# Patient Record
Sex: Male | Born: 1960
Health system: Southern US, Community
[De-identification: ages and names within clinical notes are randomized; demographics above are authoritative.]

## PROBLEM LIST (undated history)

## (undated) DIAGNOSIS — K219 Gastro-esophageal reflux disease without esophagitis: Secondary | ICD-10-CM

## (undated) DIAGNOSIS — E785 Hyperlipidemia, unspecified: Secondary | ICD-10-CM

## (undated) DIAGNOSIS — J309 Allergic rhinitis, unspecified: Secondary | ICD-10-CM

## (undated) DIAGNOSIS — E119 Type 2 diabetes mellitus without complications: Secondary | ICD-10-CM

## (undated) DIAGNOSIS — I10 Essential (primary) hypertension: Secondary | ICD-10-CM

## (undated) DIAGNOSIS — G40909 Epilepsy, unspecified, not intractable, without status epilepticus: Secondary | ICD-10-CM

## (undated) DIAGNOSIS — J4540 Moderate persistent asthma, uncomplicated: Secondary | ICD-10-CM

## (undated) DIAGNOSIS — E8881 Metabolic syndrome: Secondary | ICD-10-CM

## (undated) DIAGNOSIS — E88819 Insulin resistance, unspecified: Secondary | ICD-10-CM

## (undated) HISTORY — DX: Gastro-esophageal reflux disease without esophagitis: K21.9

## (undated) HISTORY — DX: Insulin resistance, unspecified: E88.819

## (undated) HISTORY — DX: Hyperlipidemia, unspecified: E78.5

## (undated) HISTORY — DX: Metabolic syndrome: E88.81

## (undated) HISTORY — DX: Epilepsy, unspecified, not intractable, without status epilepticus: G40.909

## (undated) HISTORY — PX: SINOSCOPY: SHX187

## (undated) HISTORY — DX: Allergic rhinitis, unspecified: J30.9

## (undated) HISTORY — DX: Moderate persistent asthma, uncomplicated: J45.40

## (undated) HISTORY — DX: Essential (primary) hypertension: I10

---

## 2008-08-29 HISTORY — PX: COLONOSCOPY: SHX174

## 2011-08-06 ENCOUNTER — Ambulatory Visit: Payer: Self-pay

## 2011-09-12 ENCOUNTER — Ambulatory Visit: Payer: Self-pay

## 2012-06-29 ENCOUNTER — Ambulatory Visit: Payer: Self-pay | Admitting: Family Medicine

## 2012-07-02 ENCOUNTER — Encounter: Payer: Self-pay | Admitting: Family Medicine

## 2012-07-02 ENCOUNTER — Ambulatory Visit (INDEPENDENT_AMBULATORY_CARE_PROVIDER_SITE_OTHER): Payer: PRIVATE HEALTH INSURANCE | Admitting: Family Medicine

## 2012-07-02 VITALS — BP 117/82 | HR 69 | Temp 98.0°F | Ht 74.0 in | Wt 221.0 lb

## 2012-07-02 DIAGNOSIS — J019 Acute sinusitis, unspecified: Secondary | ICD-10-CM

## 2012-07-02 DIAGNOSIS — H6120 Impacted cerumen, unspecified ear: Secondary | ICD-10-CM

## 2012-07-02 MED ORDER — FLUTICASONE PROPIONATE 50 MCG/ACT NA SUSP: NASAL | Status: DC

## 2012-07-02 MED ORDER — AMOXICILLIN-POT CLAVULANATE 875-125 MG PO TABS: 1.0000 | ORAL_TABLET | Freq: Two times a day (BID) | ORAL | Status: DC

## 2012-07-02 NOTE — Progress Notes (Signed)
Office Note 07/02/2012  CC:  Chief Complaint  Patient presents with  . Establish Care    ? bronchitis    HPI:  Frank Potter is a 51 y.o. White male who is here today to establish care and discuss respiratory sx's. Patient's most recent primary MD: Dr. Catha Gosselin at [facility] [employer] [facility]. Old records were not reviewed prior to or during today's visit.  Pt presents complaining of respiratory symptoms for 7  days.  Primary symptoms are: nasal congest, chest congestion.  Initially had subjective f/c.  Worst symptoms seems to be the excessive yellow/thick phlegm.  Lately the symptoms seem to be worsening.  HA at onset but no since then.  Scratchy throat at onset but gone after 24h. Pertinent negatives: Symptoms made worse by nothing.  Symptoms improved by blowing nose. Smoker? no Recent sick contact? no Muscle or joint aches? no Flu shot this season at least 2 wks ago? No.  He declines this today.  Additional ROS: no n/v/d or abdominal pain.  No rash.  No neck stiffness.   +Mild fatigue.  +Mild appetite loss.   Past Medical History  Diagnosis Date  . HTN (hypertension)   . Hyperlipidemia   . GERD (gastroesophageal reflux disease)   . Insulin resistance     Wt loss helped (55 lb wt loss)  . Seizure disorder     Post-traumatic; has been off meds and seizure-free since 06/1996.    Past Surgical History  Procedure Date  . Colonoscopy 2010    Normal per pt    No family history on file.  History   Social History  . Marital Status: Married    Spouse Name: N/A    Number of Children: N/A  . Years of Education: N/A   Occupational History  . Not on file.   Social History Main Topics  . Smoking status: Never Smoker   . Smokeless tobacco: Never Used  . Alcohol Use: No  . Drug Use: No  . Sexually Active: Not on file   Other Topics Concern  . 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  Rare alcohol.  No drugs.    Outpatient Encounter Prescriptions as of 07/02/2012  Medication Sig Dispense Refill  . amLODipine-olmesartan (AZOR) 5-20 MG per tablet Take 1 tablet by mouth daily.      Marland Kitchen amoxicillin-clavulanate (AUGMENTIN) 875-125 MG per tablet Take 1 tablet by mouth 2 (two) times daily.  20 tablet  0  . aspirin EC 81 MG tablet Take 243 mg by mouth daily.      Marland Kitchen atorvastatin (LIPITOR) 10 MG tablet Take 10 mg by mouth daily.      . Cinnamon 500 MG TABS Take 6 tablets by mouth daily.      . fluticasone (FLONASE) 50 MCG/ACT nasal spray 2 sprays each nostril once daily  16 g  6  . Omega-3 Fatty Acids (FISH OIL) 1000 MG CAPS Take 3 capsules by mouth daily.      . ranitidine (ZANTAC) 150 MG tablet Take 150 mg by mouth daily.        No Known Allergies  ROS Review of Systems  Constitutional: Negative for fever and fatigue.  HENT:       See hpi  Eyes: Negative for visual disturbance.  Respiratory: Negative for cough.   Cardiovascular: Negative for chest pain.  Gastrointestinal: Negative for nausea and  abdominal pain.  Genitourinary: Negative for dysuria.  Musculoskeletal: Negative for back pain and joint swelling.  Skin: Negative for rash.  Neurological: Negative for weakness and headaches.  Hematological: Negative for adenopathy.    PE; Blood pressure 117/82, pulse 69, temperature 98 F (36.7 C), temperature source Temporal, height 6\' 2"  (1.88 m), weight 221 lb (100.245 kg), SpO2 98.00%. VS: noted--normal. Gen: alert, NAD, NONTOXIC APPEARING.  He sounds very nasally when speaking. HEENT: eyes without injection, drainage, or swelling.  Ears: EACs with 100% cerumen impactions bilaterally.   Nose:Crusty exudate adherent to mildly injected mucosa.   Purulent d/c noted in left nostril.  Mild paranasal sinus TTP.  No facial swelling.  Throat and mouth without focal lesion.  No pharyngial  swelling, erythema, or exudate.   Neck: supple, no LAD.   LUNGS: CTA bilat, nonlabored resps.   CV: RRR, no m/r/g. EXT: no c/c/e SKIN: no rash  Pertinent labs:  None today  ASSESSMENT AND PLAN:   New Pt: obtain old records.  Sinusitis, acute I think he has minimal bronchitic component with this illness. Augmentin 875/125, 1 bid x 10d. Restart flonase qd.  Restart saline nasal spray qd. Mucinex DM OTC recommended.  Recommended flu vaccine but pt declined this today.  Cerumen impaction Bilateral 100%. Removed with irrigation by nurse today.   An After Visit Summary was printed and given to the patient.  Return if symptoms worsen or fail to improve.

## 2012-07-02 NOTE — Assessment & Plan Note (Signed)
I think he has minimal bronchitic component with this illness. Augmentin 875/125, 1 bid x 10d. Restart flonase qd.  Restart saline nasal spray qd. Mucinex DM OTC recommended.  Recommended flu vaccine but pt declined this today.

## 2012-07-02 NOTE — Assessment & Plan Note (Addendum)
Bilateral 100%. Removed with irrigation by nurse today.

## 2012-07-02 NOTE — Patient Instructions (Signed)
Buy Mucinex DM over the counter and follow instructions on package.

## 2015-01-28 ENCOUNTER — Other Ambulatory Visit: Payer: Self-pay | Admitting: Family Medicine

## 2015-01-28 DIAGNOSIS — R319 Hematuria, unspecified: Secondary | ICD-10-CM

## 2015-02-06 ENCOUNTER — Ambulatory Visit
Admission: RE | Admit: 2015-02-06 | Discharge: 2015-02-06 | Disposition: A | Discharge status: Home / Self Care | Payer: BLUE CROSS/BLUE SHIELD | Source: Ambulatory Visit | Attending: Family Medicine | Admitting: Family Medicine

## 2015-02-06 DIAGNOSIS — R319 Hematuria, unspecified: Secondary | ICD-10-CM

## 2015-06-26 ENCOUNTER — Other Ambulatory Visit: Payer: Self-pay | Admitting: Allergy and Immunology

## 2015-07-24 ENCOUNTER — Other Ambulatory Visit: Payer: Self-pay | Admitting: Allergy and Immunology

## 2015-08-08 ENCOUNTER — Other Ambulatory Visit: Payer: Self-pay | Admitting: Allergy and Immunology

## 2015-09-18 ENCOUNTER — Other Ambulatory Visit: Payer: Self-pay | Admitting: Allergy and Immunology

## 2015-11-11 ENCOUNTER — Other Ambulatory Visit: Payer: Self-pay | Admitting: Allergy and Immunology

## 2016-01-08 ENCOUNTER — Other Ambulatory Visit: Payer: Self-pay | Admitting: Allergy and Immunology

## 2016-08-20 ENCOUNTER — Other Ambulatory Visit: Payer: Self-pay | Admitting: Allergy and Immunology

## 2016-09-10 ENCOUNTER — Other Ambulatory Visit: Payer: Self-pay | Admitting: Allergy and Immunology

## 2016-10-04 ENCOUNTER — Ambulatory Visit (INDEPENDENT_AMBULATORY_CARE_PROVIDER_SITE_OTHER): Payer: BLUE CROSS/BLUE SHIELD | Admitting: Allergy and Immunology

## 2016-10-04 ENCOUNTER — Encounter: Payer: Self-pay | Admitting: Allergy and Immunology

## 2016-10-04 VITALS — BP 128/82 | HR 78 | Temp 98.2°F | Resp 16 | Ht 73.0 in | Wt 235.0 lb

## 2016-10-04 DIAGNOSIS — J4530 Mild persistent asthma, uncomplicated: Secondary | ICD-10-CM | POA: Insufficient documentation

## 2016-10-04 DIAGNOSIS — J4540 Moderate persistent asthma, uncomplicated: Secondary | ICD-10-CM | POA: Diagnosis not present

## 2016-10-04 DIAGNOSIS — J3089 Other allergic rhinitis: Secondary | ICD-10-CM | POA: Insufficient documentation

## 2016-10-04 DIAGNOSIS — Z8709 Personal history of other diseases of the respiratory system: Secondary | ICD-10-CM

## 2016-10-04 HISTORY — DX: Moderate persistent asthma, uncomplicated: J45.40

## 2016-10-04 MED ORDER — BECLOMETHASONE DIPROPIONATE 80 MCG/ACT IN AERS: 2.0000 | INHALATION_SPRAY | Freq: Two times a day (BID) | RESPIRATORY_TRACT | 5 refills | Status: DC

## 2016-10-04 NOTE — Patient Instructions (Addendum)
Moderate persistent asthma  Restart Qvar 80 g, one inhalation via spacer device twice a day.  Continue montelukast 10 mg daily at bedtime.  I have encouraged the patient to use albuterol HFA on an as needed rather than scheduled basis.  He is to take 1-2 inhalations every 4-6 hours as needed.  Subjective and objective measures of pulmonary function will be followed and the treatment plan will be adjusted accordingly.  Allergic rhinitis  Continue appropriate allergen avoidance measures, montelukast 10 mg daily, and fluticasone nasal spray daily or every other day.  I have also recommended nasal saline spray (i.e., Simply Saline) or nasal saline lavage (i.e., NeilMed) as needed prior to medicated nasal sprays.  History of nasal polyps  Continue fluticasone nasal spray every day or every other day preceded by nasal saline spray or lavage (as above).   Return in about 5 months (around 03/03/2017), or if symptoms worsen or fail to improve.

## 2016-10-04 NOTE — Assessment & Plan Note (Signed)
   Continue appropriate allergen avoidance measures, montelukast 10 mg daily, and fluticasone nasal spray daily or every other day.  I have also recommended nasal saline spray (i.e., Simply Saline) or nasal saline lavage (i.e., NeilMed) as needed prior to medicated nasal sprays.

## 2016-10-04 NOTE — Progress Notes (Signed)
Follow-up Note  RE: Frank Potter MRN: [id number] DOB: 1961/01/25 Date of Office Visit: 10/04/2016  Primary care provider: Tammi Sou, MD Referring provider: Tammi Sou, MD  History of present illness: Frank Potter is a 56 y.o. male with persistent asthma, allergic rhinitis, and history of nasal polyps presenting today for follow up.  He was last seen in this clinic in April 2016.  He reports that he ran out Qvar approximately one year ago.  He went to the pharmacy to pick up his refill and was given albuterol HFA, therefore he has been taking albuterol 1-2 times daily.  His upper and lower respiratory symptoms seem to be triggered by dry air and air pollutants which he comes in to contact with on a weekly basis while traveling out to [facility] and [place].  He takes montelukast 10 mg daily and is using fluticasone nasal spray on a daily basis as well.  He still experiences anosmia, but does not complain of persistent nasal congestion.   Assessment and plan: Moderate persistent asthma  Restart Qvar 80 g, one inhalation via spacer device twice a day.  Continue montelukast 10 mg daily at bedtime.  I have encouraged the patient to use albuterol HFA on an as needed rather than scheduled basis.  He is to take 1-2 inhalations every 4-6 hours as needed.  Subjective and objective measures of pulmonary function will be followed and the treatment plan will be adjusted accordingly.  Allergic rhinitis  Continue appropriate allergen avoidance measures, montelukast 10 mg daily, and fluticasone nasal spray daily or every other day.  I have also recommended nasal saline spray (i.e., Simply Saline) or nasal saline lavage (i.e., NeilMed) as needed prior to medicated nasal sprays.  History of nasal polyps  Continue fluticasone nasal spray every day or every other day preceded by nasal saline spray or lavage (as above).   Meds ordered this encounter  Medications  .  beclomethasone (QVAR) 80 MCG/ACT inhaler    Sig: Inhale 2 puffs into the lungs 2 (two) times daily.    Dispense:  1 Inhaler    Refill:  5    Diagnostics: Spirometry reveals an FVC of 4.81 L and an FEV1 of 3.39 L (82% predicted) without post bronchodilator improvement.  Please see scanned spirometry results for details.    Physical examination: Blood pressure 128/82, pulse 78, temperature 98.2 F (36.8 C), temperature source Oral, resp. rate 16, height 6\' 1"  (1.854 m), weight 235 lb (106.6 kg), SpO2 96 %.  General: Alert, interactive, in no acute distress. HEENT: TMs pearly gray, turbinates mildly edematous without discharge, post-pharynx mildly erythematous. Neck: Supple without lymphadenopathy. Lungs: Clear to auscultation without wheezing, rhonchi or rales. CV: Normal S1, S2 without murmurs. Skin: Warm and dry, without lesions or rashes.  The following portions of the patient's history were reviewed and updated as appropriate: allergies, current medications, past family history, past medical history, past social history, past surgical history and problem list.  Allergies as of 10/04/2016      Reactions   Bee Venom Swelling   Lac Bovis    Other reaction(s): Abdominal Pain Milk      Medication List       Accurate as of 10/04/16  1:44 PM. Always use your most recent med list.          aspirin EC 81 MG tablet Take 243 mg by mouth daily.   atorvastatin 10 MG tablet Commonly known as:  LIPITOR Take 10 mg by  mouth daily.   AZOR 5-20 MG tablet Generic drug:  amLODipine-olmesartan Take 1 tablet by mouth daily.   beclomethasone 80 MCG/ACT inhaler Commonly known as:  QVAR Inhale 2 puffs into the lungs 2 (two) times daily.   Cinnamon 500 MG Tabs Take 6 tablets by mouth daily.   Coenzyme Q10-Vitamin E 100-150 MG-UNIT Caps Take 150 mg by mouth daily.   Fish Oil 1000 MG Caps Take 3 capsules by mouth daily.   fluticasone 50 MCG/ACT nasal spray Commonly known as:   FLONASE 2 sprays each nostril once daily   montelukast 10 MG tablet Commonly known as:  SINGULAIR TAKE 1 TABLET BY MOUTH EVERY EVENING FOR COUGH OR WHEEZING   PROAIR HFA 108 (90 Base) MCG/ACT inhaler Generic drug:  albuterol INHALE 2 PUFFS INTO THE LUNGS EVERY 4 TO 6 HOURS AS NEEDED FOR COUGH OR WHEEZE. USE SPACER IF NEEDED   ranitidine 150 MG tablet Commonly known as:  ZANTAC Take 150 mg by mouth daily.       Allergies  Allergen Reactions  . Bee Venom Swelling  . Lac Bovis     Other reaction(s): Abdominal Pain Milk   Review of systems: Review of systems negative except as noted in HPI / PMHx or noted below: Constitutional: Negative.  HENT: Negative.   Eyes: Negative.  Respiratory: Negative.   Cardiovascular: Negative.  Gastrointestinal: Negative.  Genitourinary: Negative.  Musculoskeletal: Negative.  Neurological: Negative.  Endo/Heme/Allergies: Negative.  Cutaneous: Negative.  Past Medical History:  Diagnosis Date  . GERD (gastroesophageal reflux disease)   . HTN (hypertension)   . Hyperlipidemia   . Insulin resistance    Wt loss helped (55 lb wt loss)  . Moderate persistent asthma 10/04/2016  . Seizure disorder ([place])    Post-traumatic; has been off meds and seizure-free since 06/1996.    Family History  Problem Relation Age of Onset  . Allergic rhinitis Paternal Uncle     Social History   Social History  . Marital status: Married    Spouse name: N/A  . Number of children: N/A  . Years of education: N/A   Occupational History  . Not on file.   Social History Main Topics  . Smoking status: Never Smoker  . Smokeless tobacco: Never Used  . Alcohol use No  . Drug use: No  . Sexual activity: Not on file   Other Topics Concern  . Not on file   Social History Narrative   Married, no children.   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   [employer].   Native [place].   No tobacco.  Rare alcohol.  No  drugs.   Review of systems: Review of systems negative except as noted in HPI / PMHx or noted below: Constitutional: Negative.  HENT: Negative.   Eyes: Negative.  Respiratory: Negative.   Cardiovascular: Negative.  Gastrointestinal: Negative.  Genitourinary: Negative.  Musculoskeletal: Negative.  Neurological: Negative.  Endo/Heme/Allergies: Negative.  Cutaneous: Negative.  Past Medical History:  Diagnosis Date  . GERD (gastroesophageal reflux disease)   . HTN (hypertension)   . Hyperlipidemia   . Insulin resistance    Wt loss helped (55 lb wt loss)  . Moderate persistent asthma 10/04/2016  . Seizure disorder ([place])    Post-traumatic; has been off meds and seizure-free since 06/1996.    Family History  Problem Relation Age of Onset  . Allergic rhinitis Paternal Uncle     Social History   Social History  . Marital status: Married  Spouse name: N/A  . Number of children: N/A  . Years of education: N/A   Occupational History  . Not on file.   Social History Main Topics  . Smoking status: Never Smoker  . Smokeless tobacco: Never Used  . Alcohol use No  . Drug use: No  . Sexual activity: Not on file   Other Topics Concern  . Not on file   Social History Narrative   Married, no children.   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   [employer].   Native [place].   No tobacco.  Rare alcohol.  No drugs.    I appreciate the opportunity to take part in Yosiel's care. Please do not hesitate to contact me with questions.  Sincerely,   R. Edgar Frisk, MD

## 2016-10-04 NOTE — Assessment & Plan Note (Signed)
   Continue fluticasone nasal spray every day or every other day preceded by nasal saline spray or lavage (as above).

## 2016-10-04 NOTE — Assessment & Plan Note (Addendum)
   Restart Qvar 80 g, one inhalation via spacer device twice a day.  Continue montelukast 10 mg daily at bedtime.  I have encouraged the patient to use albuterol HFA on an as needed rather than scheduled basis.  He is to take 1-2 inhalations every 4-6 hours as needed.  Subjective and objective measures of pulmonary function will be followed and the treatment plan will be adjusted accordingly.

## 2017-01-09 ENCOUNTER — Other Ambulatory Visit: Payer: Self-pay | Admitting: *Deleted

## 2017-01-09 MED ORDER — FLUTICASONE PROPIONATE HFA 110 MCG/ACT IN AERO
2.0000 | INHALATION_SPRAY | Freq: Two times a day (BID) | RESPIRATORY_TRACT | 5 refills | Status: DC
Start: 2017-01-09 — End: 2017-11-23

## 2017-03-06 ENCOUNTER — Ambulatory Visit: Payer: BLUE CROSS/BLUE SHIELD | Admitting: Allergy and Immunology

## 2017-04-24 ENCOUNTER — Other Ambulatory Visit: Payer: Self-pay

## 2017-04-24 MED ORDER — ALBUTEROL SULFATE HFA 108 (90 BASE) MCG/ACT IN AERS: INHALATION_SPRAY | RESPIRATORY_TRACT | 0 refills | Status: DC

## 2017-04-24 NOTE — Telephone Encounter (Signed)
RF for Proair HFA x 1 with no refills, pt needs an OV

## 2017-05-02 ENCOUNTER — Encounter: Payer: Self-pay | Admitting: Allergy and Immunology

## 2017-05-02 ENCOUNTER — Ambulatory Visit (INDEPENDENT_AMBULATORY_CARE_PROVIDER_SITE_OTHER): Payer: BLUE CROSS/BLUE SHIELD | Admitting: Allergy and Immunology

## 2017-05-02 VITALS — BP 122/80 | HR 80 | Resp 16

## 2017-05-02 DIAGNOSIS — J4540 Moderate persistent asthma, uncomplicated: Secondary | ICD-10-CM

## 2017-05-02 DIAGNOSIS — J3089 Other allergic rhinitis: Secondary | ICD-10-CM

## 2017-05-02 DIAGNOSIS — Z8709 Personal history of other diseases of the respiratory system: Secondary | ICD-10-CM

## 2017-05-02 MED ORDER — MONTELUKAST SODIUM 10 MG PO TABS
ORAL_TABLET | ORAL | 5 refills | Status: DC
Start: 2017-05-02 — End: 2017-11-25

## 2017-05-02 NOTE — Assessment & Plan Note (Signed)
   Continue fluticasone nasal spray daily, preceded by nasal saline irrigation.

## 2017-05-02 NOTE — Assessment & Plan Note (Signed)
   Continue appropriate allergen avoidance measures, montelukast daily, fluticasone nasal spray daily, and nasal saline irrigation as needed.

## 2017-05-02 NOTE — Progress Notes (Signed)
Follow-up Note  RE: Frank Potter MRN: [id number] DOB: Sep 07, 1960 Date of Office Visit: 05/02/2017  Primary care provider: Tammi Sou, MD Referring provider: Tammi Sou, MD  History of present illness: Frank Potter is a 56 y.o. male with persistent asthma, allergic rhinitis, and history of nasal polyps presenting today for follow up.  He was last seen in this clinic in April 2016.  He reports that in the interval since his previous visit his asthma has been relatively well-controlled.  He did experience some wheezing approximately 2 or 3 weeks ago which he believes was due to the filter in his truck needing to be changed.  He currently takes Flovent 110 g, one inhalation via spacer device twice a day, and montelukast 10 mg daily at bedtime.  He experiences occasional nasal congestion and sneezing, particularly with exposure to pollen and pollution.   Assessment and plan: Moderate persistent asthma Currently well controlled.  For now, continue Flovent 110 g, one inhalation via spacer device twice a day, montelukast 10 mg daily bedtime, and albuterol HFA, 1-2 inhalations every 4-6 hours as needed.  During respiratory tract infections or asthma flares, increase Flovent 110g to 3 inhalations 3 times per day until symptoms have returned to baseline.  Subjective and objective measures of pulmonary function will be followed and the treatment plan will be adjusted accordingly.  Allergic rhinitis  Continue appropriate allergen avoidance measures, montelukast daily, fluticasone nasal spray daily, and nasal saline irrigation as needed.  History of nasal polyps  Continue fluticasone nasal spray daily, preceded by nasal saline irrigation.   Meds ordered this encounter  Medications  . montelukast (SINGULAIR) 10 MG tablet    Sig: TAKE 1 TABLET BY MOUTH EVERY EVENING FOR COUGH OR WHEEZING    Dispense:  30 tablet    Refill:  5    Diagnostics: Spirometry:  Normal  with an FEV1 of 90% predicted and an FEV1 ratio of 105%.  Please see scanned spirometry results for details.    Physical examination: Blood pressure 122/80, pulse 80, resp. rate 16.  General: Alert, interactive, in no acute distress. HEENT: TMs pearly gray, turbinates mildly edematous without discharge, post-pharynx unremarkable. Neck: Supple without lymphadenopathy. Lungs: Clear to auscultation without wheezing, rhonchi or rales. CV: Normal S1, S2 without murmurs. Skin: Warm and dry, without lesions or rashes.  The following portions of the patient's history were reviewed and updated as appropriate: allergies, current medications, past family history, past medical history, past social history, past surgical history and problem list.  Allergies as of 05/02/2017      Reactions   Bee Venom Swelling   Lac Bovis    Other reaction(s): Abdominal Pain Milk      Medication List       Accurate as of 05/02/17  7:21 PM. Always use your most recent med list.          albuterol 108 (90 Base) MCG/ACT inhaler Commonly known as:  PROAIR HFA INHALE 2 PUFFS INTO THE LUNGS EVERY 4 TO 6 HOURS AS NEEDED FOR COUGH OR WHEEZE. USE SPACER IF NEEDED   aspirin EC 81 MG tablet Take 243 mg by mouth daily.   atorvastatin 10 MG tablet Commonly known as:  LIPITOR Take 10 mg by mouth daily.   AZOR 5-20 MG tablet Generic drug:  amLODipine-olmesartan Take 1 tablet by mouth daily.   Coenzyme Q10-Vitamin E 100-150 MG-UNIT Caps Take 150 mg by mouth daily.   Fish Oil 1000 MG Caps Take 3  capsules by mouth daily.   fluticasone 110 MCG/ACT inhaler Commonly known as:  FLOVENT HFA Inhale 2 puffs into the lungs 2 (two) times daily.   fluticasone 50 MCG/ACT nasal spray Commonly known as:  FLONASE 2 sprays each nostril once daily   glimepiride 1 MG tablet Commonly known as:  AMARYL   lamoTRIgine 150 MG tablet Commonly known as:  LAMICTAL   metFORMIN 1000 MG tablet Commonly known as:  GLUCOPHAGE     montelukast 10 MG tablet Commonly known as:  SINGULAIR TAKE 1 TABLET BY MOUTH EVERY EVENING FOR COUGH OR WHEEZING   ranitidine 150 MG tablet Commonly known as:  ZANTAC Take 150 mg by mouth daily.            Discharge Care Instructions        Start     Ordered   05/02/17 0000  Spirometry with Graph    Question:  Where should this test be performed?  Answer:  Other   05/02/17 1711   05/02/17 0000  montelukast (SINGULAIR) 10 MG tablet     05/02/17 1711      Allergies  Allergen Reactions  . Bee Venom Swelling  . Lac Bovis     Other reaction(s): Abdominal Pain Milk    I appreciate the opportunity to take part in Destyn's care. Please do not hesitate to contact me with questions.  Sincerely,   R. Edgar Frisk, MD

## 2017-05-02 NOTE — Assessment & Plan Note (Signed)
Currently well controlled.  For now, continue Flovent 110 g, one inhalation via spacer device twice a day, montelukast 10 mg daily bedtime, and albuterol HFA, 1-2 inhalations every 4-6 hours as needed.  During respiratory tract infections or asthma flares, increase Flovent 110g to 3 inhalations 3 times per day until symptoms have returned to baseline.  Subjective and objective measures of pulmonary function will be followed and the treatment plan will be adjusted accordingly.

## 2017-05-02 NOTE — Patient Instructions (Signed)
Moderate persistent asthma Currently well controlled.  For now, continue Flovent 110 g, one inhalation via spacer device twice a day, montelukast 10 mg daily bedtime, and albuterol HFA, 1-2 inhalations every 4-6 hours as needed.  During respiratory tract infections or asthma flares, increase Flovent 110g to 3 inhalations 3 times per day until symptoms have returned to baseline.  Subjective and objective measures of pulmonary function will be followed and the treatment plan will be adjusted accordingly.  Allergic rhinitis  Continue appropriate allergen avoidance measures, montelukast daily, fluticasone nasal spray daily, and nasal saline irrigation as needed.  History of nasal polyps  Continue fluticasone nasal spray daily, preceded by nasal saline irrigation.   Return in about 5 months (around 10/02/2017), or if symptoms worsen or fail to improve.

## 2017-05-04 ENCOUNTER — Ambulatory Visit: Payer: BLUE CROSS/BLUE SHIELD | Admitting: Allergy and Immunology

## 2017-07-03 ENCOUNTER — Encounter: Payer: Self-pay | Admitting: Family Medicine

## 2017-10-18 ENCOUNTER — Ambulatory Visit: Payer: BLUE CROSS/BLUE SHIELD | Admitting: Allergy and Immunology

## 2017-11-23 ENCOUNTER — Ambulatory Visit (INDEPENDENT_AMBULATORY_CARE_PROVIDER_SITE_OTHER): Payer: 59 | Admitting: Allergy and Immunology

## 2017-11-23 ENCOUNTER — Encounter: Payer: Self-pay | Admitting: Allergy and Immunology

## 2017-11-23 VITALS — BP 116/76 | HR 88 | Temp 98.4°F | Resp 16

## 2017-11-23 DIAGNOSIS — Z8709 Personal history of other diseases of the respiratory system: Secondary | ICD-10-CM | POA: Diagnosis not present

## 2017-11-23 DIAGNOSIS — J4540 Moderate persistent asthma, uncomplicated: Secondary | ICD-10-CM

## 2017-11-23 DIAGNOSIS — J3089 Other allergic rhinitis: Secondary | ICD-10-CM | POA: Diagnosis not present

## 2017-11-23 MED ORDER — FLUTICASONE PROPIONATE HFA 110 MCG/ACT IN AERO: 2.0000 | INHALATION_SPRAY | Freq: Two times a day (BID) | RESPIRATORY_TRACT | 5 refills | Status: DC

## 2017-11-23 MED ORDER — ALBUTEROL SULFATE HFA 108 (90 BASE) MCG/ACT IN AERS: INHALATION_SPRAY | RESPIRATORY_TRACT | 1 refills | Status: DC

## 2017-11-23 NOTE — Assessment & Plan Note (Signed)
   For now, continue Flovent 110 g HFA, 1 inhalation via spacer device daily, montelukast 10 mg daily bedtime, and albuterol, 1-2 inhalations every 6 hours if needed.  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  Subjective and objective measures of pulmonary function will be followed and the treatment plan will be adjusted accordingly.

## 2017-11-23 NOTE — Patient Instructions (Signed)
Moderate persistent asthma  For now, continue Flovent 110 g HFA, 1 inhalation via spacer device daily, montelukast 10 mg daily bedtime, and albuterol, 1-2 inhalations every 6 hours if needed.  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  Subjective and objective measures of pulmonary function will be followed and the treatment plan will be adjusted accordingly.  Allergic rhinitis  Continue appropriate allergen avoidance measures, montelukast daily, fluticasone nasal spray daily, and nasal saline irrigation as needed.  History of nasal polyps  Continue fluticasone nasal spray daily, preceded by nasal saline irrigation.   Return in about 6 months (around 05/26/2018), or if symptoms worsen or fail to improve.

## 2017-11-23 NOTE — Assessment & Plan Note (Signed)
   Continue fluticasone nasal spray daily, preceded by nasal saline irrigation.

## 2017-11-23 NOTE — Assessment & Plan Note (Signed)
   Continue appropriate allergen avoidance measures, montelukast daily, fluticasone nasal spray daily, and nasal saline irrigation as needed.

## 2017-11-23 NOTE — Progress Notes (Signed)
Follow-up Note  RE: Frank Potter MRN: [id number] DOB: [birth year] Date of Office Visit: 11/23/2017  Primary care provider: Hulan Fess, MD Referring provider: Tammi Sou, MD  History of present illness: Frank Potter is a 57 y.o. male with persistent asthma, allergic rhinitis, and history of nasal polyps presenting today for follow-up.  He was last seen in this clinic in September 2018.  He reports that in the interval since his previous visit his asthma has been relatively well controlled.  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  He believes this is due to the "stagnant air" in the truck cab.  His nasal allergy symptoms are well controlled while using fluticasone nasal spray daily.  His sense of smell is still minimal.  Assessment and plan: Moderate persistent asthma  For now, continue Flovent 110 g HFA, 1 inhalation via spacer device daily, montelukast 10 mg daily bedtime, and albuterol, 1-2 inhalations every 6 hours if needed.  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  Subjective and objective measures of pulmonary function will be followed and the treatment plan will be adjusted accordingly.  Allergic rhinitis  Continue appropriate allergen avoidance measures, montelukast daily, fluticasone nasal spray daily, and nasal saline irrigation as needed.  History of nasal polyps  Continue fluticasone nasal spray daily, preceded by nasal saline irrigation.   Meds ordered this encounter  Medications  . albuterol (PROAIR HFA) 108 (90 Base) MCG/ACT inhaler    Sig: INHALE 2 PUFFS INTO THE LUNGS EVERY 4 TO 6 HOURS AS NEEDED FOR COUGH OR WHEEZE. USE SPACER IF NEEDED    Dispense:  1 Inhaler    Refill:  1  . fluticasone (FLOVENT HFA) 110 MCG/ACT inhaler    Sig: Inhale 2 puffs into the lungs 2 (two) times daily.    Dispense:  1  Inhaler    Refill:  5    Please keep rx on file. Pt. Will call when needed.    Diagnostics: Spirometry:  Normal with an FEV1 of 94% predicted.  Please see scanned spirometry results for details.    Physical examination: Blood pressure 116/76, pulse 88, temperature 98.4 F (36.9 C), temperature source Oral, resp. rate 16, SpO2 96 %.  General: Alert, interactive, in no acute distress. HEENT: TMs pearly gray, turbinates mildly edematous without discharge, post-pharynx unremarkable. Neck: Supple without lymphadenopathy. Lungs: Clear to auscultation without wheezing, rhonchi or rales. CV: Normal S1, S2 without murmurs. Skin: Warm and dry, without lesions or rashes.  The following portions of the patient's history were reviewed and updated as appropriate: allergies, current medications, past family history, past medical history, past social history, past surgical history and problem list.  Allergies as of 11/23/2017      Reactions   Bee Venom Swelling   Lac Bovis    Other reaction(s): Abdominal Pain Milk      Medication List        Accurate as of 11/23/17  1:34 PM. Always use your most recent med list.          albuterol 108 (90 Base) MCG/ACT inhaler Commonly known as:  PROAIR HFA INHALE 2 PUFFS INTO THE LUNGS EVERY 4 TO 6 HOURS AS NEEDED FOR COUGH OR WHEEZE. USE SPACER IF NEEDED   atorvastatin 10 MG tablet Commonly known as:  LIPITOR Take 10 mg by mouth daily.  AZOR 5-20 MG tablet Generic drug:  amLODipine-olmesartan Take 1 tablet by mouth daily.   CINNAMON PO Take 10,000 mg by mouth daily.   Coenzyme Q10-Vitamin E 100-150 MG-UNIT Caps Take 150 mg by mouth daily.   Fish Oil 1000 MG Caps Take 3 capsules by mouth daily.   fluticasone 110 MCG/ACT inhaler Commonly known as:  FLOVENT HFA Inhale 2 puffs into the lungs 2 (two) times daily.   fluticasone 50 MCG/ACT nasal spray Commonly known as:  FLONASE 2 sprays each nostril once daily   glimepiride 2 MG  tablet Commonly known as:  AMARYL   lamoTRIgine 200 MG tablet Commonly known as:  LAMICTAL   metFORMIN 1000 MG tablet Commonly known as:  GLUCOPHAGE Take 1,000 mg by mouth 2 (two) times daily.   montelukast 10 MG tablet Commonly known as:  SINGULAIR TAKE 1 TABLET BY MOUTH EVERY EVENING FOR COUGH OR WHEEZING   olmesartan 20 MG tablet Commonly known as:  BENICAR   ranitidine 150 MG tablet Commonly known as:  ZANTAC Take 150 mg by mouth daily.       Allergies  Allergen Reactions  . Bee Venom Swelling  . Lac Bovis     Other reaction(s): Abdominal Pain Milk    I appreciate the opportunity to take part in Frank Potter's care. Please do not hesitate to contact me with questions.  Sincerely,   R. Edgar Frisk, MD

## 2017-11-25 ENCOUNTER — Other Ambulatory Visit: Payer: Self-pay | Admitting: Allergy and Immunology

## 2017-11-25 DIAGNOSIS — J4540 Moderate persistent asthma, uncomplicated: Secondary | ICD-10-CM

## 2017-11-25 DIAGNOSIS — J3089 Other allergic rhinitis: Secondary | ICD-10-CM

## 2017-12-21 DIAGNOSIS — M9901 Segmental and somatic dysfunction of cervical region: Secondary | ICD-10-CM | POA: Diagnosis not present

## 2017-12-21 DIAGNOSIS — M5032 Other cervical disc degeneration, mid-cervical region, unspecified level: Secondary | ICD-10-CM | POA: Diagnosis not present

## 2017-12-21 DIAGNOSIS — M9905 Segmental and somatic dysfunction of pelvic region: Secondary | ICD-10-CM | POA: Diagnosis not present

## 2018-01-04 DIAGNOSIS — M5032 Other cervical disc degeneration, mid-cervical region, unspecified level: Secondary | ICD-10-CM | POA: Diagnosis not present

## 2018-01-04 DIAGNOSIS — M9901 Segmental and somatic dysfunction of cervical region: Secondary | ICD-10-CM | POA: Diagnosis not present

## 2018-01-04 DIAGNOSIS — M9905 Segmental and somatic dysfunction of pelvic region: Secondary | ICD-10-CM | POA: Diagnosis not present

## 2018-01-11 DIAGNOSIS — M5032 Other cervical disc degeneration, mid-cervical region, unspecified level: Secondary | ICD-10-CM | POA: Diagnosis not present

## 2018-01-11 DIAGNOSIS — M9905 Segmental and somatic dysfunction of pelvic region: Secondary | ICD-10-CM | POA: Diagnosis not present

## 2018-01-11 DIAGNOSIS — M9901 Segmental and somatic dysfunction of cervical region: Secondary | ICD-10-CM | POA: Diagnosis not present

## 2018-01-18 DIAGNOSIS — M9905 Segmental and somatic dysfunction of pelvic region: Secondary | ICD-10-CM | POA: Diagnosis not present

## 2018-01-18 DIAGNOSIS — M9901 Segmental and somatic dysfunction of cervical region: Secondary | ICD-10-CM | POA: Diagnosis not present

## 2018-01-18 DIAGNOSIS — R197 Diarrhea, unspecified: Secondary | ICD-10-CM | POA: Diagnosis not present

## 2018-01-18 DIAGNOSIS — M5032 Other cervical disc degeneration, mid-cervical region, unspecified level: Secondary | ICD-10-CM | POA: Diagnosis not present

## 2018-01-19 DIAGNOSIS — R197 Diarrhea, unspecified: Secondary | ICD-10-CM | POA: Diagnosis not present

## 2018-01-23 DIAGNOSIS — J45909 Unspecified asthma, uncomplicated: Secondary | ICD-10-CM | POA: Diagnosis not present

## 2018-01-23 DIAGNOSIS — J069 Acute upper respiratory infection, unspecified: Secondary | ICD-10-CM | POA: Diagnosis not present

## 2018-02-01 DIAGNOSIS — M9901 Segmental and somatic dysfunction of cervical region: Secondary | ICD-10-CM | POA: Diagnosis not present

## 2018-02-01 DIAGNOSIS — M9905 Segmental and somatic dysfunction of pelvic region: Secondary | ICD-10-CM | POA: Diagnosis not present

## 2018-02-01 DIAGNOSIS — M5032 Other cervical disc degeneration, mid-cervical region, unspecified level: Secondary | ICD-10-CM | POA: Diagnosis not present

## 2018-02-15 DIAGNOSIS — M9901 Segmental and somatic dysfunction of cervical region: Secondary | ICD-10-CM | POA: Diagnosis not present

## 2018-02-15 DIAGNOSIS — M5032 Other cervical disc degeneration, mid-cervical region, unspecified level: Secondary | ICD-10-CM | POA: Diagnosis not present

## 2018-02-15 DIAGNOSIS — M9905 Segmental and somatic dysfunction of pelvic region: Secondary | ICD-10-CM | POA: Diagnosis not present

## 2018-03-14 NOTE — Progress Notes (Signed)
This note was forwarded/CC'd to me but in review of his chart I see that I am no longer his PCP.

## 2018-03-22 DIAGNOSIS — R197 Diarrhea, unspecified: Secondary | ICD-10-CM | POA: Diagnosis not present

## 2018-03-22 DIAGNOSIS — D126 Benign neoplasm of colon, unspecified: Secondary | ICD-10-CM | POA: Diagnosis not present

## 2018-04-16 DIAGNOSIS — M5032 Other cervical disc degeneration, mid-cervical region, unspecified level: Secondary | ICD-10-CM | POA: Diagnosis not present

## 2018-05-10 DIAGNOSIS — E1169 Type 2 diabetes mellitus with other specified complication: Secondary | ICD-10-CM | POA: Diagnosis not present

## 2018-05-10 DIAGNOSIS — E782 Mixed hyperlipidemia: Secondary | ICD-10-CM | POA: Diagnosis not present

## 2018-05-10 DIAGNOSIS — Z0000 Encounter for general adult medical examination without abnormal findings: Secondary | ICD-10-CM | POA: Diagnosis not present

## 2018-06-17 ENCOUNTER — Other Ambulatory Visit: Payer: Self-pay | Admitting: Allergy and Immunology

## 2018-06-17 DIAGNOSIS — J4540 Moderate persistent asthma, uncomplicated: Secondary | ICD-10-CM

## 2018-06-17 DIAGNOSIS — J3089 Other allergic rhinitis: Secondary | ICD-10-CM

## 2018-06-18 NOTE — Telephone Encounter (Signed)
Denied refill montelukast.  Per chart, Return in about 6 months (around 05/26/2018), or if symptoms worsen or fail to improve.

## 2018-06-26 DIAGNOSIS — J4541 Moderate persistent asthma with (acute) exacerbation: Secondary | ICD-10-CM | POA: Diagnosis not present

## 2018-07-05 ENCOUNTER — Ambulatory Visit: Payer: 59 | Admitting: Family Medicine

## 2018-07-05 ENCOUNTER — Encounter: Payer: Self-pay | Admitting: Allergy and Immunology

## 2018-07-05 VITALS — BP 120/72 | HR 90 | Temp 98.2°F | Resp 18 | Ht 74.25 in | Wt 220.8 lb

## 2018-07-05 DIAGNOSIS — J4540 Moderate persistent asthma, uncomplicated: Secondary | ICD-10-CM

## 2018-07-05 DIAGNOSIS — J3089 Other allergic rhinitis: Secondary | ICD-10-CM | POA: Diagnosis not present

## 2018-07-05 DIAGNOSIS — Z8709 Personal history of other diseases of the respiratory system: Secondary | ICD-10-CM | POA: Diagnosis not present

## 2018-07-05 MED ORDER — MONTELUKAST SODIUM 10 MG PO TABS: 10.0000 mg | ORAL_TABLET | Freq: Every day | ORAL | 5 refills | Status: DC

## 2018-07-05 NOTE — Patient Instructions (Addendum)
Asthma Continue Flovent 100-2 puffs once a day with a spacer to prevent cough or wheeze Continue montelukast 10 mg once a day to prevent cough and wheeze ProAir 2 puffs every 4 hours as needed for cough or wheeze  Allergic rhinitis Continue allergen avoidance measures Flonase nasal spray 2 sprays in each nostril once a day Consider nasal saline rinses as needed  History of Nasal Polyp Continue Flonase nasal spray 2 sprays in each nostril once a day (as above)  Follow up in 6 months or sooner if needed

## 2018-07-05 NOTE — Progress Notes (Addendum)
[place] [id number] Dept: [phone]  FOLLOW UP NOTE  Patient ID: KHYAN OATS, male    DOB: April 28, 1961  Age: 57 y.o. MRN: [id number] Date of Office Visit: 07/05/2018  Assessment  Chief Complaint: Follow-up  HPI Frank Potter is a 57 year old male who presents to the clinic for a follow up visit. He was last seen in this clinic on 11/23/2017 by Dr. Verlin Fester for evaluation of asthma, allergic rhinitis, and history of nasal polyp. He reports his asthma has been well controlled with no shortness of breath or wheeze with activity or rest. He reports an occasional cough that sometimes produces clear mucus. He continues Flovent 110-2 puffs once a day and montelukast 10 mg once a day. He reports he is currently using his albuterol inhaler every morning on a schedule and is not short of breath, coughing or wheezing when he uses this. Allergic rhinitis is reported as well controlled with Flonase daily. He continues to experience long time anosmia. His current medications are listed in the chart.   Drug Allergies:  Allergies  Allergen Reactions  . Bee Venom Swelling  . Lac Bovis     Other reaction(s): Abdominal Pain Milk    Physical Exam: BP 120/72 (BP Location: Left Arm, Patient Position: Sitting, Cuff Size: Normal)   Pulse 90   Temp 98.2 F (36.8 C) (Oral)   Resp 18   Ht 6' 2.25" (1.886 m)   Wt 220 lb 12.8 oz (100.2 kg)   SpO2 98%   BMI 28.16 kg/m    Physical Exam  Constitutional: He is oriented to person, place, and time. He appears well-developed and well-nourished.  HENT:  Head: Normocephalic.  Right Ear: External ear normal.  Left Ear: External ear normal.  Mouth/Throat: Oropharynx is clear and moist.  Bilateral nares erythematous and edematous with no nasal drainage noted.  Pharynx normal.  Ears normal.  Eyes normal.  Eyes: Conjunctivae are normal.  Neck: Normal range of motion. Neck supple.  Cardiovascular: Normal rate, regular rhythm  and normal heart sounds.  No murmur noted  Pulmonary/Chest: Effort normal and breath sounds normal.  Lungs clear to auscultation  Musculoskeletal: Normal range of motion.  Neurological: He is alert and oriented to person, place, and time.  Skin: Skin is warm and dry.  Psychiatric: He has a normal mood and affect. His behavior is normal. Judgment and thought content normal.  Vitals reviewed.   Diagnostics: FVC 5.40, FEV1 3.79.  Predicted FVC 4.95, predicted FEV1 3.75.  Spirometry is within the normal range.  Assessment and Plan: 1. Moderate persistent asthma without complication   2. Allergic rhinitis   3. History of nasal polyps     Meds ordered this encounter  Medications  . montelukast (SINGULAIR) 10 MG tablet    Sig: Take 1 tablet (10 mg total) by mouth at bedtime.    Dispense:  30 tablet    Refill:  5    Patient Instructions  Asthma Continue Flovent 100-2 puffs once a day with a spacer to prevent cough or wheeze Continue montelukast 10 mg once a day to prevent cough and wheeze ProAir 2 puffs every 4 hours as needed for cough or wheeze  Allergic rhinitis Continue allergen avoidance measures Flonase nasal spray 2 sprays in each nostril once a day Consider nasal saline rinses as needed  History of Nasal Polyp Continue Flonase nasal spray 2 sprays in each nostril once a day (as above)  Follow up in 6  months or sooner if needed   Return in about 6 months (around 01/03/2019).    Thank you for the opportunity to care for this patient.  Please do not hesitate to contact me with questions.  Gareth Morgan, FNP [facility] [place]   _________________________________________________  I have provided oversight concerning [facility]'s evaluation and treatment of this patient's health issues addressed during today's encounter.  I agree with the assessment and therapeutic plan as outlined in the note.   Signed,   R Edgar Frisk, MD

## 2019-01-12 ENCOUNTER — Other Ambulatory Visit: Payer: Self-pay | Admitting: Family Medicine

## 2019-01-12 DIAGNOSIS — J3089 Other allergic rhinitis: Secondary | ICD-10-CM

## 2019-01-12 DIAGNOSIS — J4540 Moderate persistent asthma, uncomplicated: Secondary | ICD-10-CM

## 2019-07-22 ENCOUNTER — Other Ambulatory Visit: Payer: Self-pay | Admitting: Family Medicine

## 2019-07-22 DIAGNOSIS — J3089 Other allergic rhinitis: Secondary | ICD-10-CM

## 2019-07-22 DIAGNOSIS — J4540 Moderate persistent asthma, uncomplicated: Secondary | ICD-10-CM

## 2019-09-26 DIAGNOSIS — I10 Essential (primary) hypertension: Secondary | ICD-10-CM | POA: Insufficient documentation

## 2019-09-26 DIAGNOSIS — R9439 Abnormal result of other cardiovascular function study: Secondary | ICD-10-CM | POA: Insufficient documentation

## 2019-09-26 DIAGNOSIS — E119 Type 2 diabetes mellitus without complications: Secondary | ICD-10-CM | POA: Insufficient documentation

## 2019-09-26 DIAGNOSIS — E785 Hyperlipidemia, unspecified: Secondary | ICD-10-CM | POA: Insufficient documentation

## 2019-11-14 ENCOUNTER — Encounter: Payer: Self-pay | Admitting: Allergy and Immunology

## 2019-11-14 ENCOUNTER — Other Ambulatory Visit: Payer: Self-pay

## 2019-11-14 ENCOUNTER — Ambulatory Visit: Payer: Managed Care, Other (non HMO) | Admitting: Allergy and Immunology

## 2019-11-14 VITALS — BP 116/70 | HR 76 | Temp 98.3°F | Resp 20 | Ht 74.0 in | Wt 209.9 lb

## 2019-11-14 DIAGNOSIS — J4540 Moderate persistent asthma, uncomplicated: Secondary | ICD-10-CM | POA: Diagnosis not present

## 2019-11-14 DIAGNOSIS — J3089 Other allergic rhinitis: Secondary | ICD-10-CM

## 2019-11-14 DIAGNOSIS — J330 Polyp of nasal cavity: Secondary | ICD-10-CM | POA: Insufficient documentation

## 2019-11-14 DIAGNOSIS — J4530 Mild persistent asthma, uncomplicated: Secondary | ICD-10-CM

## 2019-11-14 MED ORDER — MONTELUKAST SODIUM 10 MG PO TABS: 10.0000 mg | ORAL_TABLET | Freq: Every day | ORAL | 5 refills | Status: DC

## 2019-11-14 MED ORDER — XHANCE 93 MCG/ACT NA EXHU: 2.0000 | INHALANT_SUSPENSION | Freq: Two times a day (BID) | NASAL | 5 refills | Status: DC

## 2019-11-14 MED ORDER — ALBUTEROL SULFATE HFA 108 (90 BASE) MCG/ACT IN AERS: 2.0000 | INHALATION_SPRAY | RESPIRATORY_TRACT | 1 refills | Status: DC | PRN

## 2019-11-14 MED ORDER — FLOVENT HFA 110 MCG/ACT IN AERO: INHALATION_SPRAY | RESPIRATORY_TRACT | 5 refills | Status: DC

## 2019-11-14 NOTE — Assessment & Plan Note (Addendum)
   Restart montelukast 10 mg daily bedtime.  A refill prescription has been provided.  Continue albuterol, 1-2 inhalations every 4-6 hours if needed.  During respiratory tract infections or asthma flares, add Flovent 110g 2 inhalations 2 times per day until symptoms have returned to baseline.  Subjective and objective measures of pulmonary function will be followed and the treatment plan will be adjusted accordingly.

## 2019-11-14 NOTE — Progress Notes (Signed)
Follow-up Note  RE: Frank Potter MRN: [id number] DOB: December 25, 1960 Date of Office Visit: 11/14/2019  Primary care provider: Hulan Fess, MD Referring provider: Hulan Fess, MD  History of present illness: Frank Potter is a 59 y.o. male with persistent asthma, allergic rhinitis, and history of nasal polyps presenting today for follow-up.  He was last seen in this clinic in March 2019.  He reports that he ran out of montelukast approximately 4 months ago.  Despite this, he has rarely required albuterol rescue and does not experience nocturnal awakenings due to lower respiratory symptoms.  He reports that the last time that he required burst therapy with Flovent was approximately 1 year ago.  He reports that he has experienced significant increase in nasal congestion, left greater than right.  He admits that he has not been using fluticasone nasal spray consistently.  Assessment and plan: Mild persistent asthma  Restart montelukast 10 mg daily bedtime.  A refill prescription has been provided.  Continue albuterol, 1-2 inhalations every 4-6 hours if needed.  During respiratory tract infections or asthma flares, add Flovent 110g 2 inhalations 2 times per day until symptoms have returned to baseline.  Subjective and objective measures of pulmonary function will be followed and the treatment plan will be adjusted accordingly.  Allergic rhinitis  Continue appropriate allergen avoidance measures and montelukast daily.  A prescription has been provided for [facility], 2 actuations per nostril twice a day. Proper technique has been discussed and demonstrated.  Nasal saline spray (i.e., Simply Saline) or nasal saline lavage (i.e., NeilMed) is recommended as needed and prior to medicated nasal sprays.  Polyp of nasal cavity  Prednisone has been provided, 40 mg x3 days, 20 mg x1 day, 10 mg x1 day, then stop.  Truett Perna has been prescribed (as above).   Meds ordered this encounter    Medications  . montelukast (SINGULAIR) 10 MG tablet    Sig: Take 1 tablet (10 mg total) by mouth at bedtime.    Dispense:  30 tablet    Refill:  5  . fluticasone (FLOVENT HFA) 110 MCG/ACT inhaler    Sig: Two puffs with spacer twice a day during asthma flares.    Dispense:  12 g    Refill:  5  . albuterol (PROAIR HFA) 108 (90 Base) MCG/ACT inhaler    Sig: Inhale 2 puffs into the lungs every 4 (four) hours as needed for wheezing or shortness of breath.    Dispense:  18 g    Refill:  1  . Fluticasone Propionate (XHANCE) 93 MCG/ACT EXHU    Sig: Place 2 sprays into the nose 2 (two) times daily.    Dispense:  32 mL    Refill:  5    Diagnostics: Spirometry reveals an FVC of 5.25 L and an FEV1 of 3.94 L (95% predicted).  This study was performed while the patient was asymptomatic.  Please see scanned spirometry results for details.    Physical examination: Blood pressure 116/70, pulse 76, temperature 98.3 F (36.8 C), temperature source Oral, resp. rate 20, height 6\' 2"  (1.88 m), weight 209 lb 14.1 oz (95.2 kg), SpO2 98 %.   General: Alert, interactive, in no acute distress. HEENT: TMs pearly gray, turbinates mildly edematous with clear discharge, post-pharynx mildly erythematous.  Nasal polyp visible on the left. Neck: Supple without lymphadenopathy. Lungs: Clear to auscultation without wheezing, rhonchi or rales. CV: Normal S1, S2 without murmurs. Skin: Warm and dry, without lesions or rashes.  The following  portions of the patient's history were reviewed and updated as appropriate: allergies, current medications, past family history, past medical history, past social history, past surgical history and problem list.   Current Outpatient Medications  Medication Sig Dispense Refill  . albuterol (PROAIR HFA) 108 (90 Base) MCG/ACT inhaler Inhale 2 puffs into the lungs every 4 (four) hours as needed for wheezing or shortness of breath. 18 g 1  . amLODipine (NORVASC) 5 MG tablet Take  by mouth.    Marland Kitchen aspirin 81 MG EC tablet Take by mouth.    Marland Kitchen atorvastatin (LIPITOR) 40 MG tablet Take by mouth.    . calcium carbonate (OS-CAL) 1250 (500 Ca) MG chewable tablet Chew by mouth.    . Coenzyme Q10 50 MG CAPS Take by mouth.    . Coenzyme Q10-Vitamin E 100-150 MG-UNIT CAPS Take 150 mg by mouth daily.    . fluticasone (FLOVENT HFA) 110 MCG/ACT inhaler Two puffs with spacer twice a day during asthma flares. 12 g 5  . glimepiride (AMARYL) 2 MG tablet     . lamoTRIgine (LAMICTAL) 200 MG tablet     . metFORMIN (GLUCOPHAGE) 1000 MG tablet Take 1,000 mg by mouth 2 (two) times daily.     Marland Kitchen METOPROLOL SUCCINATE ER PO Take 25 mg by mouth daily.    . valsartan (DIOVAN) 80 MG tablet Take 80 mg by mouth daily.    Marland Kitchen amLODipine-olmesartan (AZOR) 5-20 MG per tablet Take 1 tablet by mouth daily.    . Fluticasone Propionate (XHANCE) 93 MCG/ACT EXHU Place 2 sprays into the nose 2 (two) times daily. 32 mL 5  . montelukast (SINGULAIR) 10 MG tablet Take 1 tablet (10 mg total) by mouth at bedtime. 30 tablet 5  . valsartan (DIOVAN) 160 MG tablet Take 160 mg by mouth daily.     No current facility-administered medications for this visit.    Allergies  Allergen Reactions  . Bee Venom Swelling  . Lac Bovis     Other reaction(s): Abdominal Pain Milk   Review of systems: Review of systems negative except as noted in HPI / PMHx.  Past Medical History:  Diagnosis Date  . Allergic rhinitis   . GERD (gastroesophageal reflux disease)   . HTN (hypertension)   . Hyperlipidemia   . Insulin resistance    Wt loss helped (55 lb wt loss)  . Moderate persistent asthma 10/04/2016  . Seizure disorder ([place])    Post-traumatic; has been off meds and seizure-free since 06/1996.    Family History  Problem Relation Age of Onset  . Allergic rhinitis Paternal Uncle     Social History   Socioeconomic History  . Marital status: Married    Spouse name: Not on file  . Number of children: Not on file  . Years of  education: Not on file  . Highest education level: Not on file  Occupational History  . Not on file  Tobacco Use  . Smoking status: Never Smoker  . Smokeless tobacco: Never Used  Substance and Sexual Activity  . Alcohol use: No  . Drug use: No  . Sexual activity: Not on file  Other Topics Concern  . Not on file  Social History Narrative   Married, no children.   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   [employer].   [place].   No tobacco.  Rare alcohol.  No drugs.   Social Determinants of Health   Financial Resource Strain:   . Difficulty of  Paying Living Expenses:   Food Insecurity:   . Worried About Charity fundraiser in the Last Year:   . Arboriculturist in the Last Year:   Transportation Needs:   . Film/video editor (Medical):   Marland Kitchen Lack of Transportation (Non-Medical):   Physical Activity:   . Days of Exercise per Week:   . Minutes of Exercise per Session:   Stress:   . Feeling of Stress :   Social Connections:   . Frequency of Communication with Friends and Family:   . Frequency of Social Gatherings with Friends and Family:   . Attends Religious Services:   . Active Member of Clubs or Organizations:   . Attends Archivist Meetings:   Marland Kitchen Marital Status:   Intimate Partner Violence:   . Fear of Current or Ex-Partner:   . Emotionally Abused:   Marland Kitchen Physically Abused:   . Sexually Abused:     I appreciate the opportunity to take part in Javan's care. Please do not hesitate to contact me with questions.  Sincerely,   R. Edgar Frisk, MD

## 2019-11-14 NOTE — Patient Instructions (Addendum)
Mild persistent asthma  Restart montelukast 10 mg daily bedtime.  A refill prescription has been provided.  Continue albuterol, 1-2 inhalations every 4-6 hours if needed.  During respiratory tract infections or asthma flares, add Flovent 110g 2 inhalations 2 times per day until symptoms have returned to baseline.  Subjective and objective measures of pulmonary function will be followed and the treatment plan will be adjusted accordingly.  Allergic rhinitis  Continue appropriate allergen avoidance measures and montelukast daily.  A prescription has been provided for [facility], 2 actuations per nostril twice a day. Proper technique has been discussed and demonstrated.  Nasal saline spray (i.e., Simply Saline) or nasal saline lavage (i.e., NeilMed) is recommended as needed and prior to medicated nasal sprays.  Polyp of nasal cavity  Prednisone has been provided, 40 mg x3 days, 20 mg x1 day, 10 mg x1 day, then stop.  Truett Perna has been prescribed (as above).   Return in about 6 months (around 05/16/2020), or if symptoms worsen or fail to improve.

## 2019-11-14 NOTE — Assessment & Plan Note (Signed)
   Continue appropriate allergen avoidance measures and montelukast daily.  A prescription has been provided for [facility], 2 actuations per nostril twice a day. Proper technique has been discussed and demonstrated.  Nasal saline spray (i.e., Simply Saline) or nasal saline lavage (i.e., NeilMed) is recommended as needed and prior to medicated nasal sprays.

## 2019-11-14 NOTE — Assessment & Plan Note (Signed)
   Prednisone has been provided, 40 mg x3 days, 20 mg x1 day, 10 mg x1 day, then stop.  Frank Potter has been prescribed (as above).

## 2020-04-20 ENCOUNTER — Other Ambulatory Visit: Payer: Self-pay | Admitting: *Deleted

## 2020-04-20 ENCOUNTER — Other Ambulatory Visit: Payer: Self-pay | Admitting: Allergy and Immunology

## 2020-04-20 MED ORDER — MONTELUKAST SODIUM 10 MG PO TABS
10.0000 mg | ORAL_TABLET | Freq: Every day | ORAL | 1 refills | Status: DC
Start: 2020-04-20 — End: 2021-01-20

## 2020-06-25 LAB — LIPID PANEL
Cholesterol: 160 (ref 0–200)
HDL: 36 (ref 35–70)
LDL Cholesterol: 95
Triglycerides: 166 — AB (ref 40–160)

## 2020-06-25 LAB — MICROALBUMIN, URINE: Microalb, Ur: 0.7

## 2020-06-25 LAB — TSH: TSH: 2.22 (ref 0.41–5.90)

## 2020-06-26 ENCOUNTER — Encounter (HOSPITAL_COMMUNITY): Payer: Self-pay

## 2020-06-26 ENCOUNTER — Emergency Department (HOSPITAL_COMMUNITY)
Admission: EM | Admit: 2020-06-26 | Discharge: 2020-06-27 | Disposition: A | Discharge status: Home / Self Care | Payer: Managed Care, Other (non HMO) | Attending: Emergency Medicine | Admitting: Emergency Medicine

## 2020-06-26 ENCOUNTER — Other Ambulatory Visit: Payer: Self-pay

## 2020-06-26 DIAGNOSIS — Z79899 Other long term (current) drug therapy: Secondary | ICD-10-CM | POA: Insufficient documentation

## 2020-06-26 DIAGNOSIS — I10 Essential (primary) hypertension: Secondary | ICD-10-CM | POA: Insufficient documentation

## 2020-06-26 DIAGNOSIS — J4540 Moderate persistent asthma, uncomplicated: Secondary | ICD-10-CM | POA: Insufficient documentation

## 2020-06-26 DIAGNOSIS — R55 Syncope and collapse: Secondary | ICD-10-CM | POA: Insufficient documentation

## 2020-06-26 DIAGNOSIS — Z7982 Long term (current) use of aspirin: Secondary | ICD-10-CM | POA: Diagnosis not present

## 2020-06-26 DIAGNOSIS — Z7984 Long term (current) use of oral hypoglycemic drugs: Secondary | ICD-10-CM | POA: Insufficient documentation

## 2020-06-26 DIAGNOSIS — E119 Type 2 diabetes mellitus without complications: Secondary | ICD-10-CM | POA: Insufficient documentation

## 2020-06-26 DIAGNOSIS — R4020 Unspecified coma: Secondary | ICD-10-CM

## 2020-06-26 HISTORY — DX: Type 2 diabetes mellitus without complications: E11.9

## 2020-06-26 NOTE — ED Triage Notes (Signed)
Pt was at work and had syncopal episode, fell on face (no facial trauma). Pt received 2nd shingles vaccine yesterday and hasn't felt well since- c/o body aches. Pt c/o headache and weakness. EMS reported ST elevation- gave 324asa & 1 sl nitro

## 2020-06-27 ENCOUNTER — Emergency Department (HOSPITAL_COMMUNITY): Payer: Managed Care, Other (non HMO)

## 2020-06-27 LAB — TROPONIN I (HIGH SENSITIVITY)
Troponin I (High Sensitivity): 4 ng/L (ref <18)
Troponin I (High Sensitivity): 3 ng/L (ref <18)

## 2020-06-27 LAB — CBC WITH DIFFERENTIAL/PLATELET
Abs Immature Granulocytes: 0.03 10*3/uL (ref 0.00–0.07)
Basophils Absolute: 0.1 10*3/uL (ref 0.0–0.1)
Basophils Relative: 1 %
Eosinophils Absolute: 0.3 10*3/uL (ref 0.0–0.5)
Eosinophils Relative: 2 %
HCT: 41 % (ref 39.0–52.0)
Hemoglobin: 13.7 g/dL (ref 13.0–17.0)
Immature Granulocytes: 0 %
Lymphocytes Relative: 19 %
Lymphs Abs: 2.2 10*3/uL (ref 0.7–4.0)
MCH: 29.5 pg (ref 26.0–34.0)
MCHC: 33.4 g/dL (ref 30.0–36.0)
MCV: 88.2 fL (ref 80.0–100.0)
Monocytes Absolute: 1.6 10*3/uL — ABNORMAL HIGH (ref 0.1–1.0)
Monocytes Relative: 14 %
Neutro Abs: 7.2 10*3/uL (ref 1.7–7.7)
Neutrophils Relative %: 64 %
Platelets: 186 10*3/uL (ref 150–400)
RBC: 4.65 MIL/uL (ref 4.22–5.81)
RDW: 12 % (ref 11.5–15.5)
WBC: 11.3 10*3/uL — ABNORMAL HIGH (ref 4.0–10.5)
nRBC: 0 % (ref 0.0–0.2)

## 2020-06-27 LAB — COMPREHENSIVE METABOLIC PANEL
ALT: 27 U/L (ref 0–44)
AST: 23 U/L (ref 15–41)
Albumin: 3.6 g/dL (ref 3.5–5.0)
Alkaline Phosphatase: 58 U/L (ref 38–126)
Anion gap: 14 (ref 5–15)
BUN: 18 mg/dL (ref 8–23)
CO2: 21 mmol/L — ABNORMAL LOW (ref 22–32)
Calcium: 9 mg/dL (ref 8.9–10.3)
Chloride: 101 mmol/L (ref 98–111)
Creatinine, Ser: 0.94 mg/dL (ref 0.61–1.24)
GFR, Estimated: >60 mL/min (ref >60)
Glucose, Bld: 115 mg/dL — ABNORMAL HIGH (ref 70–99)
Potassium: 3.6 mmol/L (ref 3.5–5.1)
Sodium: 136 mmol/L (ref 135–145)
Total Bilirubin: 0.7 mg/dL (ref 0.3–1.2)
Total Protein: 6 g/dL — ABNORMAL LOW (ref 6.5–8.1)

## 2020-06-27 LAB — D-DIMER, QUANTITATIVE: D-Dimer, Quant: 0.42 ug/mL-FEU (ref 0.00–0.50)

## 2020-06-27 MED ORDER — LACTATED RINGERS IV BOLUS
1000.0000 mL | Freq: Once | INTRAVENOUS | Status: AC
  Administered 2020-06-27: 1000 mL via INTRAVENOUS

## 2020-06-27 MED ORDER — FENTANYL CITRATE (PF) 100 MCG/2ML IJ SOLN
50.0000 ug | Freq: Once | INTRAMUSCULAR | Status: AC
  Administered 2020-06-27: 50 ug via INTRAVENOUS
  Filled 2020-06-27: qty 2

## 2020-06-27 NOTE — ED Provider Notes (Signed)
[facility] EMERGENCY DEPARTMENT Provider Note   CSN: [id number] Arrival date & time: 06/26/20  2345     History Chief Complaint  Patient presents with  . Loss of Consciousness    Frank Potter is a 59 y.o. male.  59 year old male with a history of diabetes, hypertension, hyperlipidemia and a posttraumatic seizure disorder without a seizure in the last 20+ years the presents emergency department today with a loss of consciousness.  Patient states he got a shingles vaccine yesterday.  He is kind of felt crummy since then with body aches, decreased appetite and just general unwellness.  He states he was walking in the hallway started get lightheaded and woke up on the floor.  Patient without any significant pain complaints at this time aside from a mild headache.  States no chest pain, shortness of breath but did have some mild nausea over the last 24 hours.  EMS was concerned for elevated segments on his EKG to give him some aspirin and 1 nitro brought him here.  Patient states he just had a clean catheterization recently secondary to some abnormal EKG findings.        Past Medical History:  Diagnosis Date  . Allergic rhinitis   . Diabetes mellitus without complication ([place])   . GERD (gastroesophageal reflux disease)   . HTN (hypertension)   . Hyperlipidemia   . Insulin resistance    Wt loss helped (55 lb wt loss)  . Moderate persistent asthma 10/04/2016  . Seizure disorder ([place])    Post-traumatic; has been off meds and seizure-free since 06/1996.    Patient Active Problem List   Diagnosis Date Noted  . Polyp of nasal cavity 11/14/2019  . Mild persistent asthma 10/04/2016  . Allergic rhinitis 10/04/2016  . Cerumen impaction 07/02/2012  . Sinusitis, acute 07/02/2012    Past Surgical History:  Procedure Laterality Date  . COLONOSCOPY  2010   Normal per pt  . SINOSCOPY         Family History  Problem Relation Age of Onset  . Allergic  rhinitis Paternal Uncle     Social History   Tobacco Use  . Smoking status: Never Smoker  . Smokeless tobacco: Never Used  Vaping Use  . Vaping Use: Never used  Substance Use Topics  . Alcohol use: Yes    Comment: occ  . Drug use: No    Home Medications Prior to Admission medications   Medication Sig Start Date End Date Taking? Authorizing Provider  albuterol (PROAIR HFA) 108 (90 Base) MCG/ACT inhaler Inhale 2 puffs into the lungs every 4 (four) hours as needed for wheezing or shortness of breath. 11/14/19   Bobbitt, Sedalia Muta, MD  amLODipine (NORVASC) 5 MG tablet Take by mouth. 09/27/19   [provider]  amLODipine-olmesartan (AZOR) 5-20 MG per tablet Take 1 tablet by mouth daily.    [provider]  aspirin 81 MG EC tablet Take by mouth. 09/28/19   [provider]  atorvastatin (LIPITOR) 40 MG tablet Take by mouth. 09/28/19   [provider]  calcium carbonate (OS-CAL) 1250 (500 Ca) MG chewable tablet Chew by mouth.    [provider]  Coenzyme Q10 50 MG CAPS Take by mouth.    [provider]  Coenzyme Q10-Vitamin E 100-150 MG-UNIT CAPS Take 150 mg by mouth daily.    [provider]  fluticasone (FLOVENT HFA) 110 MCG/ACT inhaler Two puffs with spacer twice a day during asthma flares. 11/14/19  Bobbitt, Sedalia Muta, MD  Fluticasone Propionate Truett Perna) 93 MCG/ACT EXHU Place 2 sprays into the nose 2 (two) times daily. 11/14/19   Bobbitt, Sedalia Muta, MD  glimepiride (AMARYL) 2 MG tablet  11/18/17   [provider]  lamoTRIgine (LAMICTAL) 200 MG tablet  11/18/17   [provider]  metFORMIN (GLUCOPHAGE) 1000 MG tablet Take 1,000 mg by mouth 2 (two) times daily.  03/04/17   [provider]  METOPROLOL SUCCINATE ER PO Take 25 mg by mouth daily.    [provider]  montelukast (SINGULAIR) 10 MG tablet Take 1 tablet (10 mg total) by mouth at bedtime. 04/20/20   Bobbitt, Sedalia Muta, MD    valsartan (DIOVAN) 160 MG tablet Take 160 mg by mouth daily.    [provider]  valsartan (DIOVAN) 80 MG tablet Take 80 mg by mouth daily.    [provider]    Allergies    Bee venom and Lac bovis  Review of Systems   Review of Systems  All other systems reviewed and are negative.   Physical Exam Updated Vital Signs BP 130/74 (BP Location: Right Arm)   Pulse 72   Temp 98 F (36.7 C) (Oral)   Resp 18   Ht 6\' 2"  (1.88 m)   Wt 96.2 kg   SpO2 98%   BMI 27.22 kg/m   Physical Exam Vitals and nursing note reviewed.  Constitutional:      Appearance: He is well-developed.  HENT:     Head: Normocephalic and atraumatic.     Nose: No congestion or rhinorrhea.     Mouth/Throat:     Mouth: Mucous membranes are moist.     Pharynx: Oropharynx is clear.  Eyes:     Pupils: Pupils are equal, round, and reactive to light.  Cardiovascular:     Rate and Rhythm: Normal rate.  Pulmonary:     Effort: Pulmonary effort is normal. No respiratory distress.  Abdominal:     General: Abdomen is flat. There is no distension.  Musculoskeletal:        General: Normal range of motion.     Cervical back: Normal range of motion.  Skin:    General: Skin is warm and dry.     Coloration: Skin is not jaundiced or pale.  Neurological:     General: No focal deficit present.     Mental Status: He is alert.     ED Results / Procedures / Treatments   Labs (all labs ordered are listed, but only abnormal results are displayed) Labs Reviewed  CBC WITH DIFFERENTIAL/PLATELET - Abnormal; Notable for the following components:      Result Value   WBC 11.3 (*)    Monocytes Absolute 1.6 (*)    All other components within normal limits  COMPREHENSIVE METABOLIC PANEL - Abnormal; Notable for the following components:   CO2 21 (*)    Glucose, Bld 115 (*)    Total Protein 6.0 (*)    All other components within normal limits  D-DIMER, QUANTITATIVE (NOT AT [facility])  TROPONIN I (HIGH  SENSITIVITY)  TROPONIN I (HIGH SENSITIVITY)    EKG EKG Interpretation  Date/Time:  Friday June 26 2020 23:47:35 EDT Ventricular Rate:  78 PR Interval:    QRS Duration: 100 QT Interval:  371 QTC Calculation: 423 R Axis:   -39 Text Interpretation: Sinus rhythm Left axis deviation Abnormal R-wave progression, late transition ST elevation, consider anterior injury No old tracing to compare Confirmed by Yarel Kilcrease, Corene Cornea (  [id number]) on 06/26/2020 11:52:59 PM   Radiology CT Head Wo Contrast  Result Date: 06/27/2020 CLINICAL DATA:  Syncopal episode. EXAM: CT HEAD WITHOUT CONTRAST TECHNIQUE: Contiguous axial images were obtained from the base of the skull through the vertex without intravenous contrast. COMPARISON:  None. FINDINGS: Brain: No evidence of acute infarction, hemorrhage, hydrocephalus, extra-axial collection or mass lesion/mass effect. Vascular: No hyperdense vessel or unexpected calcification. Skull: Normal. Negative for fracture or focal lesion. Sinuses/Orbits: There is marked severity hyperdense sphenoid sinus, bilateral ethmoid sinus and bilateral nasal mucosal thickening. Mild frontal sinus and mild bilateral maxillary sinus mucosal thickening is also seen Other: None. IMPRESSION: 1. No acute intracranial abnormality. 2. Marked severity paranasal sinus disease, as described above. Electronically Signed   By: Virgina Norfolk M.D.   On: 06/27/2020 02:35   CT Cervical Spine Wo Contrast  Result Date: 06/27/2020 CLINICAL DATA:  Syncopal episode. EXAM: CT CERVICAL SPINE WITHOUT CONTRAST TECHNIQUE: Multidetector CT imaging of the cervical spine was performed without intravenous contrast. Multiplanar CT image reconstructions were also generated. COMPARISON:  None. FINDINGS: Alignment: Normal. Skull base and vertebrae: No acute fracture. No primary bone lesion or focal pathologic process. Soft tissues and spinal canal: No prevertebral fluid or swelling. No visible canal hematoma. Disc levels:  Mild endplate sclerosis and anterior osteophyte formation is seen at the levels of C4-C5, C5-C6 and C6-C7. Mild intervertebral disc space narrowing is also seen at these levels. Mild bilateral multilevel facet joint hypertrophy is noted. Upper chest: Negative. Other: Marked severity sphenoid sinus, bilateral ethmoid sinus and bilateral nasal mucosal thickening is seen. Mild bilateral maxillary sinus mucosal thickening is also noted. IMPRESSION: 1. Mild multilevel degenerative disc disease and facet joint hypertrophy. 2. Marked severity pansinus disease. Electronically Signed   By: Virgina Norfolk M.D.   On: 06/27/2020 02:42   DG Chest Portable 1 View  Result Date: 06/27/2020 CLINICAL DATA:  Syncope EXAM: PORTABLE CHEST 1 VIEW COMPARISON:  None. FINDINGS: The heart size and mediastinal contours are within normal limits. Both lungs are clear. The visualized skeletal structures are unremarkable. IMPRESSION: No active disease. Electronically Signed   By: Fidela Salisbury MD   On: 06/27/2020 00:35   CT Maxillofacial Wo Contrast  Result Date: 06/27/2020 CLINICAL DATA:  Syncopal episode. EXAM: CT MAXILLOFACIAL WITHOUT CONTRAST TECHNIQUE: Multidetector CT imaging of the maxillofacial structures was performed. Multiplanar CT image reconstructions were also generated. COMPARISON:  None. FINDINGS: Osseous: No fracture or mandibular dislocation. No destructive process. Orbits: Negative. No traumatic or inflammatory finding. Sinuses: Marked severity sphenoid sinus, bilateral ethmoid sinus and bilateral nasal mucosal thickening is seen. Mild frontal sinus and mild bilateral maxillary sinus mucosal thickening is also noted. Soft tissues: Negative. Limited intracranial: No significant or unexpected finding. IMPRESSION: 1. Marked severity paranasal sinus disease. 2. No acute osseous abnormality. Electronically Signed   By: Virgina Norfolk M.D.   On: 06/27/2020 02:38    Procedures Procedures (including critical care  time)  Medications Ordered in ED Medications  lactated ringers bolus 1,000 mL (0 mLs Intravenous Stopped 06/27/20 0255)  fentaNYL (SUBLIMAZE) injection 50 mcg (50 mcg Intravenous Given 06/27/20 0037)    ED Course  I have reviewed the triage vital signs and the nursing notes.  Pertinent labs & imaging results that were available during my care of the patient were reviewed by me and considered in my medical decision making (see chart for details).    MDM Rules/Calculators/A&P  Consider possible seizures however did have a postictal state, incontinence, tongue biting or seizure like activity reported.  He also has not had a seizure in well over 20 years.  I think is more likely to be had orthostatic syncopal type event.  Did do delta troponins because of the abnormal EKG however I suspect this is similar to his EKG for which she had a clean catheterization recently.  He has no neurologic changes to suspect stroke or neurologic cause for syncope.  Patient feels well.  His vital signs are stable.  I feel he is appropriate to be discharged to continue work-up as an outpatient with his PCP.   Final Clinical Impression(s) / ED Diagnoses Final diagnoses:  LOC (loss of consciousness) [facility])    Rx / DC Orders ED Discharge Orders    None       Keeva Reisen, Corene Cornea, MD 06/28/20 0127

## 2020-09-18 ENCOUNTER — Other Ambulatory Visit: Payer: Self-pay

## 2020-09-18 MED ORDER — XHANCE 93 MCG/ACT NA EXHU: 2.0000 | INHALANT_SUSPENSION | Freq: Two times a day (BID) | NASAL | 0 refills | Status: DC

## 2020-09-18 NOTE — Telephone Encounter (Signed)
Pt. Made an appointment for 10/30/2020 with Dr. Neldon Mc.

## 2020-09-18 NOTE — Telephone Encounter (Signed)
xhance courtesy refill only. Left message for pt.to call office to schedule office visit. No more refills until pt. Is seen.

## 2020-10-29 LAB — PSA: PSA: 1

## 2020-10-29 LAB — HEMOGLOBIN A1C: Hemoglobin A1C: 6.8

## 2020-10-30 ENCOUNTER — Other Ambulatory Visit: Payer: Self-pay

## 2020-10-30 ENCOUNTER — Ambulatory Visit: Payer: Managed Care, Other (non HMO) | Admitting: Allergy and Immunology

## 2020-10-30 ENCOUNTER — Encounter: Payer: Self-pay | Admitting: Allergy and Immunology

## 2020-10-30 VITALS — BP 102/62 | HR 66 | Temp 97.2°F | Resp 18 | Ht 74.0 in | Wt 214.0 lb

## 2020-10-30 DIAGNOSIS — J330 Polyp of nasal cavity: Secondary | ICD-10-CM | POA: Diagnosis not present

## 2020-10-30 DIAGNOSIS — J3089 Other allergic rhinitis: Secondary | ICD-10-CM | POA: Diagnosis not present

## 2020-10-30 DIAGNOSIS — J4530 Mild persistent asthma, uncomplicated: Secondary | ICD-10-CM | POA: Diagnosis not present

## 2020-10-30 MED ORDER — DUPILUMAB 300 MG/2ML ~~LOC~~ SOSY
300.0000 mg | PREFILLED_SYRINGE | SUBCUTANEOUS | Status: DC
  Administered 2020-10-30: 300 mg via SUBCUTANEOUS

## 2020-10-30 MED ORDER — XHANCE 93 MCG/ACT NA EXHU
2.0000 | INHALANT_SUSPENSION | Freq: Two times a day (BID) | NASAL | 5 refills | Status: DC
Start: 2020-10-30 — End: 2020-11-27

## 2020-10-30 NOTE — Progress Notes (Signed)
[facility]   Follow-up Note  Referring Provider: Hulan Fess, MD Primary Provider: Hulan Fess, MD Date of Office Visit: 10/30/2020  Subjective:   Frank Potter (DOB: 07/08/1961) is a 60 y.o. male who returns to the [facility] on 10/30/2020 in re-evaluation of the following:  HPI: Frank Potter returns to this clinic in evaluation of asthma and allergic rhinitis and nasal polyposis.  His last visit to this clinic with Dr. Verlin Fester was 14 November 2019.  He has very bad nasal polyposis and cannot breathe through his nose and has not been able to smell for 15 years.  He has required a sinus surgery and nasal polypectomy in 2015 for this issue.  He continues to use [place] on a daily basis.  He continues to use montelukast on a daily basis.  No therapy to date has ever helped his nasal polyposis.  His asthma is not a particularly big issue.  He rarely uses a short acting bronchodilator.  He has not utilized any Flovent.  He has not required a systemic steroid to treat an exacerbation of asthma.  Allergies as of 10/30/2020      Reactions   Bee Venom Swelling   Lac Bovis    Other reaction(s): Abdominal Pain Milk      Medication List      albuterol 108 (90 Base) MCG/ACT inhaler Commonly known as: ProAir HFA Inhale 2 puffs into the lungs every 4 (four) hours as needed for wheezing or shortness of breath.   amLODipine 5 MG tablet Commonly known as: NORVASC Take by mouth.   aspirin 81 MG EC tablet Take by mouth.   atorvastatin 40 MG tablet Commonly known as: LIPITOR Take by mouth.   Coenzyme Q10 50 MG Caps Take by mouth.   Flovent HFA 110 MCG/ACT inhaler Generic drug: fluticasone Two puffs with spacer twice a day during asthma flares.   glimepiride 2 MG tablet Commonly known as: AMARYL   lamoTRIgine 200 MG tablet Commonly known as: LAMICTAL   metFORMIN 1000 MG tablet Commonly known as: GLUCOPHAGE Take  1,000 mg by mouth 2 (two) times daily.   METOPROLOL SUCCINATE ER PO Take 25 mg by mouth daily.   montelukast 10 MG tablet Commonly known as: SINGULAIR Take 1 tablet (10 mg total) by mouth at bedtime.   valsartan 160 MG tablet Commonly known as: DIOVAN Take 160 mg by mouth daily.   valsartan 80 MG tablet Commonly known as: DIOVAN Take 80 mg by mouth daily.   Xhance 93 MCG/ACT Exhu Generic drug: Fluticasone Propionate Place 2 sprays into the nose 2 (two) times daily.       Past Medical History:  Diagnosis Date  . Allergic rhinitis   . Diabetes mellitus without complication ([place])   . GERD (gastroesophageal reflux disease)   . HTN (hypertension)   . Hyperlipidemia   . Insulin resistance    Wt loss helped (55 lb wt loss)  . Moderate persistent asthma 10/04/2016  . Seizure disorder ([place])    Post-traumatic; has been off meds and seizure-free since 06/1996.    Past Surgical History:  Procedure Laterality Date  . COLONOSCOPY  2010   Normal per pt  . SINOSCOPY      Review of systems negative except as noted in HPI / PMHx or noted below:  Review of Systems  Constitutional: Negative.   HENT: Negative.   Eyes: Negative.   Respiratory: Negative.   Cardiovascular: Negative.  Gastrointestinal: Negative.   Genitourinary: Negative.   Musculoskeletal: Negative.   Skin: Negative.   Neurological: Negative.   Endo/Heme/Allergies: Negative.   Psychiatric/Behavioral: Negative.      Objective:   Vitals:   10/30/20 1529  BP: 102/62  Pulse: 66  Resp: 18  Temp: (!) 97.2 F (36.2 C)  SpO2: 96%   Height: 6\' 2"  (188 cm)  Weight: 214 lb (97.1 kg)   Physical Exam Constitutional:      Appearance: He is not diaphoretic.  HENT:     Head: Normocephalic.     Right Ear: Tympanic membrane, ear canal and external ear normal.     Left Ear: Tympanic membrane, ear canal and external ear normal.     Nose: Mucosal edema (Bilateral nasal polyposis obliterating nasal airway)  present. No rhinorrhea.     Mouth/Throat:     Mouth: Oropharynx is clear and moist and mucous membranes are normal.     Pharynx: Uvula midline. No oropharyngeal exudate.  Eyes:     Conjunctiva/sclera: Conjunctivae normal.  Neck:     Thyroid: No thyromegaly.     Trachea: Trachea normal. No tracheal tenderness or tracheal deviation.  Cardiovascular:     Rate and Rhythm: Normal rate and regular rhythm.     Heart sounds: Normal heart sounds, S1 normal and S2 normal. No murmur heard.   Pulmonary:     Effort: No respiratory distress.     Breath sounds: Normal breath sounds. No stridor. No wheezing or rales.  Musculoskeletal:        General: No edema.  Lymphadenopathy:     Head:     Right side of head: No tonsillar adenopathy.     Left side of head: No tonsillar adenopathy.     Cervical: No cervical adenopathy.  Skin:    Findings: No erythema or rash.     Nails: There is no clubbing.  Neurological:     Mental Status: He is alert.     Diagnostics:   The patient had an Asthma Control Test with the following results: ACT Total Score: 20.    Assessment and Plan:   1. Polyp of nasal cavity   2. Allergic rhinitis   3. Asthma, well controlled, mild persistent     1.  Dupixent administered in clinic today and every 2 weeks at home  2.  Continue XHANCE 2 sprays each nostril twice a day  3.  Continue montelukast 10 mg tablet 1 time per day  4.  Continue albuterol HFA 2 inhalations every 4-6 hours if needed  5.  Return to clinic in 4 weeks or earlier if problem  Frank Potter has failed nasal polypectomy, montelukast, and XHANCE nasal spray regarding control of his nasal polyps and he is now a candidate for dupilumab.  We will start that medicine today and have him utilize this medicine every 2 weeks.  Allena Katz, MD Allergy / Immunology [place]

## 2020-10-30 NOTE — Patient Instructions (Addendum)
  1.  Dupixent administered in clinic today and every 2 weeks at home  2.  Continue XHANCE 2 sprays each nostril twice a day  3.  Continue montelukast 10 mg tablet 1 time per day  4.  Continue albuterol HFA 2 inhalations every 4-6 hours if needed  5.  Return to clinic in 4 weeks or earlier if problem

## 2020-10-30 NOTE — Progress Notes (Signed)
Immunotherapy   Patient Details  Name: Frank Potter MRN: [id number] Date of Birth: 02-16-1961  10/30/2020  Frank Potter started a sample of Dupixent 300 mg loading dose today for nasal polyps.  Following schedule: 300 mg Q 2 weeks  Epi-Pen:Epi-Pen Available  Consent signed and patient instructions given.   Jeniffer Culliver J Leon Montoya 10/30/2020, 3:56 PM

## 2020-11-02 ENCOUNTER — Encounter: Payer: Self-pay | Admitting: Allergy and Immunology

## 2020-11-03 ENCOUNTER — Telehealth: Payer: Self-pay | Admitting: *Deleted

## 2020-11-03 NOTE — Telephone Encounter (Signed)
L/m for patient to contact me in regards to approval copay card and submit to [facility] for [place]

## 2020-11-03 NOTE — Telephone Encounter (Signed)
Advised patient of approval, submit to [facility] and copay card info to his email

## 2020-11-27 ENCOUNTER — Ambulatory Visit: Payer: Managed Care, Other (non HMO) | Admitting: Allergy and Immunology

## 2020-11-27 ENCOUNTER — Other Ambulatory Visit: Payer: Self-pay

## 2020-11-27 VITALS — BP 134/78 | HR 68 | Resp 16

## 2020-11-27 DIAGNOSIS — J3089 Other allergic rhinitis: Secondary | ICD-10-CM

## 2020-11-27 DIAGNOSIS — J4530 Mild persistent asthma, uncomplicated: Secondary | ICD-10-CM | POA: Diagnosis not present

## 2020-11-27 DIAGNOSIS — J330 Polyp of nasal cavity: Secondary | ICD-10-CM

## 2020-11-27 MED ORDER — XHANCE 93 MCG/ACT NA EXHU
2.0000 | INHALANT_SUSPENSION | Freq: Two times a day (BID) | NASAL | 5 refills | Status: DC
Start: 2020-11-27 — End: 2021-03-09

## 2020-11-27 NOTE — Patient Instructions (Addendum)
  1.  Dupixent administered every 2 weeks at home  2.  Continue XHANCE or Flonase 2 sprays each nostril twice a day  3.  Continue montelukast 10 mg tablet 1 time per day  4.  Start prednisone 10 mg - 1 tablet 1 time per day for 14 days only (blood sugars)  5.  Continue albuterol HFA 2 inhalations every 4-6 hours if needed  6.  Return to clinic in 12 weeks or earlier if problem

## 2020-11-27 NOTE — Progress Notes (Signed)
[name]   Follow-up Note  Referring Provider: Hulan Fess, MD Primary Provider: Hulan Fess, MD Date of Office Visit: 11/27/2020  Subjective:   Frank Potter (DOB: 1960/09/22) is a 60 y.o. male who returns to the [facility] on 11/27/2020 in re-evaluation of the following:  HPI: Frank Potter returns to this clinic in reevaluation of allergic rhinitis and nasal polyposis and history of asthma.  His last visit to this clinic was 30 October 2020.  During his last visit we started him on dupilumab.  He has had a very good response regarding his dupilumab and he feels much less congested in his upper airway.  He stopped his nasal steroid.  He did visit with an ENT doctor who documented a decrease in the size of his nasal polyps.  He has noticed that when he drinks liquid it has a "burnt" taste.  Has had no issues with his asthma.  Allergies as of 11/27/2020      Reactions   Bee Venom Swelling   Lac Bovis    Other reaction(s): Abdominal Pain Milk      Medication List      albuterol 108 (90 Base) MCG/ACT inhaler Commonly known as: ProAir HFA Inhale 2 puffs into the lungs every 4 (four) hours as needed for wheezing or shortness of breath.   amLODipine 5 MG tablet Commonly known as: NORVASC Take by mouth.   aspirin 81 MG EC tablet Take by mouth.   atorvastatin 40 MG tablet Commonly known as: LIPITOR Take by mouth.   Coenzyme Q10 50 MG Caps Take by mouth.   Dupixent 300 MG/2ML prefilled syringe Generic drug: dupilumab Inject into the skin.   Flovent HFA 110 MCG/ACT inhaler Generic drug: fluticasone Two puffs with spacer twice a day during asthma flares.   glimepiride 2 MG tablet Commonly known as: AMARYL   lamoTRIgine 200 MG tablet Commonly known as: LAMICTAL   metFORMIN 1000 MG tablet Commonly known as: GLUCOPHAGE Take 1,000 mg by mouth 2 (two) times daily.   metoprolol succinate 25 MG 24 hr  tablet Commonly known as: TOPROL-XL Take 25 mg by mouth daily.   montelukast 10 MG tablet Commonly known as: SINGULAIR Take 1 tablet (10 mg total) by mouth at bedtime.   valsartan 80 MG tablet Commonly known as: DIOVAN Take 80 mg by mouth daily.   Xhance 93 MCG/ACT Exhu Generic drug: Fluticasone Propionate Place 2 sprays into the nose 2 (two) times daily.       Past Medical History:  Diagnosis Date  . Allergic rhinitis   . Diabetes mellitus without complication ([place])   . GERD (gastroesophageal reflux disease)   . HTN (hypertension)   . Hyperlipidemia   . Insulin resistance    Wt loss helped (55 lb wt loss)  . Moderate persistent asthma 10/04/2016  . Seizure disorder ([place])    Post-traumatic; has been off meds and seizure-free since 06/1996.    Past Surgical History:  Procedure Laterality Date  . COLONOSCOPY  2010   Normal per pt  . SINOSCOPY      Review of systems negative except as noted in HPI / PMHx or noted below:  Review of Systems  Constitutional: Negative.   HENT: Negative.   Eyes: Negative.   Respiratory: Negative.   Cardiovascular: Negative.   Gastrointestinal: Negative.   Genitourinary: Negative.   Musculoskeletal: Negative.   Skin: Negative.   Neurological: Negative.   Endo/Heme/Allergies: Negative.  Psychiatric/Behavioral: Negative.      Objective:   Vitals:   11/27/20 1559  BP: 134/78  Pulse: 68  Resp: 16  SpO2: 97%          Physical Exam Constitutional:      Appearance: He is not diaphoretic.  HENT:     Head: Normocephalic.     Right Ear: Tympanic membrane, ear canal and external ear normal.     Left Ear: Tympanic membrane, ear canal and external ear normal.     Nose: Nose normal. No mucosal edema (Bilateral nasal polyposis, approximately 50% decrease in size from previous exam) or rhinorrhea.     Mouth/Throat:     Pharynx: Uvula midline. No oropharyngeal exudate.  Eyes:     Conjunctiva/sclera: Conjunctivae normal.  Neck:      Thyroid: No thyromegaly.     Trachea: Trachea normal. No tracheal tenderness or tracheal deviation.  Cardiovascular:     Rate and Rhythm: Normal rate and regular rhythm.     Heart sounds: Normal heart sounds, S1 normal and S2 normal. No murmur heard.   Pulmonary:     Effort: No respiratory distress.     Breath sounds: Normal breath sounds. No stridor. No wheezing or rales.  Lymphadenopathy:     Head:     Right side of head: No tonsillar adenopathy.     Left side of head: No tonsillar adenopathy.     Cervical: No cervical adenopathy.  Skin:    Findings: No erythema or rash.     Nails: There is no clubbing.  Neurological:     Mental Status: He is alert.     Diagnostics: none  Assessment and Plan:   1. Polyp of nasal cavity   2. Allergic rhinitis   3. Asthma, well controlled, mild persistent     1.  Dupixent administered every 2 weeks at home  2.  Continue XHANCE or Flonase 2 sprays each nostril twice a day  3.  Continue montelukast 10 mg tablet 1 time per day  4.  Start prednisone 10 mg - 1 tablet 1 time per day for 14 days only (blood sugars)  5.  Continue albuterol HFA 2 inhalations every 4-6 hours if needed  6.  Return to clinic in 12 weeks or earlier if problem  I am going to have frank utilize a low-dose of systemic steroids for the next 14 days and I have encouraged him to consistently use his nasal steroid while he continues on dupilumab.  Hopefully this plan will result in complete resolution of his nasal polyps and prevent them from returning.  I will see him back in his clinic in 12 weeks or earlier if there is a problem.  Allena Katz, MD Allergy / Immunology [place]

## 2020-11-30 ENCOUNTER — Encounter: Payer: Self-pay | Admitting: Allergy and Immunology

## 2021-01-20 ENCOUNTER — Other Ambulatory Visit: Payer: Self-pay

## 2021-01-20 MED ORDER — MONTELUKAST SODIUM 10 MG PO TABS: 10.0000 mg | ORAL_TABLET | Freq: Every day | ORAL | 0 refills | Status: DC

## 2021-02-15 LAB — HM DIABETES EYE EXAM

## 2021-03-08 ENCOUNTER — Other Ambulatory Visit: Payer: Self-pay

## 2021-03-08 ENCOUNTER — Encounter: Payer: Self-pay | Admitting: Internal Medicine

## 2021-03-08 ENCOUNTER — Ambulatory Visit: Payer: Managed Care, Other (non HMO) | Admitting: Internal Medicine

## 2021-03-08 VITALS — BP 136/82 | HR 78 | Temp 98.2°F | Ht 74.0 in | Wt 217.2 lb

## 2021-03-08 DIAGNOSIS — I10 Essential (primary) hypertension: Secondary | ICD-10-CM

## 2021-03-08 DIAGNOSIS — B351 Tinea unguium: Secondary | ICD-10-CM

## 2021-03-08 DIAGNOSIS — E119 Type 2 diabetes mellitus without complications: Secondary | ICD-10-CM

## 2021-03-08 DIAGNOSIS — E785 Hyperlipidemia, unspecified: Secondary | ICD-10-CM | POA: Diagnosis not present

## 2021-03-08 LAB — POCT GLYCOSYLATED HEMOGLOBIN (HGB A1C): Hemoglobin A1C: 8 % — AB (ref 4.0–5.6)

## 2021-03-08 MED ORDER — TERBINAFINE HCL 250 MG PO TABS: 250.0000 mg | ORAL_TABLET | Freq: Every day | ORAL | 0 refills | Status: DC

## 2021-03-08 MED ORDER — METFORMIN HCL 1000 MG PO TABS: 1000.0000 mg | ORAL_TABLET | Freq: Two times a day (BID) | ORAL | 1 refills | Status: DC

## 2021-03-08 MED ORDER — DAPAGLIFLOZIN PROPANEDIOL 10 MG PO TABS: 10.0000 mg | ORAL_TABLET | Freq: Every day | ORAL | 1 refills | Status: DC

## 2021-03-08 MED ORDER — ATORVASTATIN CALCIUM 40 MG PO TABS: 40.0000 mg | ORAL_TABLET | Freq: Every day | ORAL | 1 refills | Status: DC

## 2021-03-08 NOTE — Progress Notes (Signed)
Subjective:  Patient ID: Frank Potter, male    DOB: [birth year]  Age: 60 y.o. MRN: [id number]  CC: New Patient (Initial Visit)  This visit occurred during the SARS-CoV-2 public health emergency.  Safety protocols were in place, including screening questions prior to the visit, additional usage of staff PPE, and extensive cleaning of exam room while observing appropriate contact time as indicated for disinfecting solutions.    HPI [name] presents for f/up and to establish.  He complains that his great toenails are uncomfortable, discolored, and thick.  He would like to have this treated.  He has DM2 and is being treated with a sulfonylurea and metformin.  He does not monitor his blood sugars.  He complains of polyuria but denies polyphagia or polydipsia.  He is very active and denies any recent episodes of chest pain, shortness of breath, diaphoresis, dizziness, or lightheadedness.  History Frank Potter has a past medical history of Allergic rhinitis, Diabetes mellitus without complication ([place]), GERD (gastroesophageal reflux disease), HTN (hypertension), Hyperlipidemia, Insulin resistance, Moderate persistent asthma (10/04/2016), and Seizure disorder ([place]).   He has a past surgical history that includes Colonoscopy (2010) and Sinoscopy.   His family history includes Allergic rhinitis in his paternal uncle; Cancer in his mother; Diabetes in his mother; Hyperlipidemia in his mother and sister; Hypertension in his father, mother, and sister; Stroke in his mother.He reports that he has never smoked. He has never used smokeless tobacco. He reports current alcohol use of about 4.0 standard drinks of alcohol per week. He reports that he does not use drugs.  Outpatient Medications Prior to Visit  Medication Sig Dispense Refill   albuterol (PROAIR HFA) 108 (90 Base) MCG/ACT inhaler Inhale 2 puffs into the lungs every 4 (four) hours as needed for wheezing or shortness of breath. 18 g  1   amLODipine (NORVASC) 5 MG tablet Take by mouth.     Coenzyme Q10 50 MG CAPS Take by mouth.     DUPIXENT 300 MG/2ML prefilled syringe Inject into the skin.     fluticasone (FLOVENT HFA) 110 MCG/ACT inhaler Two puffs with spacer twice a day during asthma flares. 12 g 5   Fluticasone Propionate (XHANCE) 93 MCG/ACT EXHU Place 2 sprays into the nose 2 (two) times daily. 32 mL 5   lamoTRIgine (LAMICTAL) 200 MG tablet      metoprolol succinate (TOPROL-XL) 25 MG 24 hr tablet Take 25 mg by mouth daily.     montelukast (SINGULAIR) 10 MG tablet Take 1 tablet (10 mg total) by mouth at bedtime. 90 tablet 0   valsartan (DIOVAN) 80 MG tablet Take 80 mg by mouth daily.     aspirin 81 MG EC tablet Take by mouth.     atorvastatin (LIPITOR) 40 MG tablet Take by mouth.     glimepiride (AMARYL) 2 MG tablet      metFORMIN (GLUCOPHAGE) 1000 MG tablet Take 1,000 mg by mouth 2 (two) times daily.      dupilumab (DUPIXENT) prefilled syringe 300 mg      No facility-administered medications prior to visit.    ROS Review of Systems  Constitutional:  Negative for chills, diaphoresis, fatigue and fever.  Eyes: Negative.   Respiratory:  Negative for chest tightness, shortness of breath and wheezing.   Cardiovascular:  Negative for chest pain, palpitations and leg swelling.  Gastrointestinal:  Negative for abdominal pain, constipation, diarrhea and vomiting.  Endocrine: Positive for polyuria.  Genitourinary:  Negative for difficulty urinating  and scrotal swelling.  Musculoskeletal: Negative.  Negative for arthralgias and myalgias.  Skin: Negative.   Neurological: Negative.  Negative for dizziness, weakness and light-headedness.  Hematological:  Negative for adenopathy. Does not bruise/bleed easily.  Psychiatric/Behavioral: Negative.     Objective:  BP 136/82 (BP Location: Left Arm, Patient Position: Sitting, Cuff Size: Large)   Pulse 78   Temp 98.2 F (36.8 C) (Oral)   Ht 6\' 2"  (1.88 m)   Wt 217 lb 3.2 oz  (98.5 kg)   SpO2 96%   BMI 27.89 kg/m   Physical Exam Vitals reviewed.  Constitutional:      Appearance: Normal appearance.  HENT:     Nose: Nose normal.     Mouth/Throat:     Mouth: Mucous membranes are moist.  Eyes:     General: No scleral icterus.    Conjunctiva/sclera: Conjunctivae normal.  Cardiovascular:     Rate and Rhythm: Normal rate and regular rhythm.     Pulses:          Dorsalis pedis pulses are 1+ on the right side and 1+ on the left side.       Posterior tibial pulses are 1+ on the right side and 1+ on the left side.     Heart sounds: No murmur heard.   No gallop.  Pulmonary:     Effort: Pulmonary effort is normal.     Breath sounds: No stridor. No wheezing, rhonchi or rales.  Abdominal:     General: Abdomen is flat. Bowel sounds are normal. There is no distension.     Palpations: Abdomen is soft. There is no hepatomegaly, splenomegaly or mass.     Tenderness: There is no abdominal tenderness.  Musculoskeletal:        General: Normal range of motion.     Cervical back: Neck supple.     Right lower leg: No edema.     Left lower leg: No edema.     Right foot: Normal range of motion.     Left foot: Normal range of motion.  Feet:     Right foot:     Skin integrity: Skin integrity normal.     Toenail Condition: Right toenails are abnormally thick. Fungal disease present.    Left foot:     Skin integrity: Skin integrity normal.     Toenail Condition: Left toenails are abnormally thick. Fungal disease present. Lymphadenopathy:     Cervical: No cervical adenopathy.  Skin:    General: Skin is warm and dry.  Neurological:     General: No focal deficit present.     Mental Status: He is alert.  Psychiatric:        Mood and Affect: Mood normal.        Behavior: Behavior normal.    Lab Results  Component Value Date   WBC 11.3 (H) 06/27/2020   HGB 13.7 06/27/2020   HCT 41.0 06/27/2020   PLT 186 06/27/2020   GLUCOSE 115 (H) 06/27/2020   CHOL 160  06/25/2020   TRIG 166 (A) 06/25/2020   HDL 36 06/25/2020   LDLCALC 95 06/25/2020   ALT 27 06/27/2020   AST 23 06/27/2020   NA 136 06/27/2020   K 3.6 06/27/2020   CL 101 06/27/2020   CREATININE 0.94 06/27/2020   BUN 18 06/27/2020   CO2 21 (L) 06/27/2020   TSH 2.22 06/25/2020   PSA 1.00 10/29/2020   HGBA1C 8.0 (A) 03/08/2021   MICROALBUR 0.7 06/25/2020  Assessment & Plan:   Guido was seen today for new patient (initial visit).  Diagnoses and all orders for this visit:  Type 2 diabetes mellitus without complication, without long-term current use of insulin ([place])- His A1c is up to 8.0%.  I recommended that he be more compliant with metformin and to stop taking the sulfonylurea.  I think he would get better results if he took an SGLT2 inhibitor. -     HM Diabetes Foot Exam -     POCT glycosylated hemoglobin (Hb A1C) -     metFORMIN (GLUCOPHAGE) 1000 MG tablet; Take 1 tablet (1,000 mg total) by mouth 2 (two) times daily with a meal. -     dapagliflozin propanediol (FARXIGA) 10 MG TABS tablet; Take 1 tablet (10 mg total) by mouth daily before breakfast.  Primary hypertension- His blood pressure is adequately well controlled.  Onychomycosis of toenail -     terbinafine (LAMISIL) 250 MG tablet; Take 1 tablet (250 mg total) by mouth daily.  Hyperlipidemia LDL goal <100- LDL goal achieved. Doing well on the statin  -     atorvastatin (LIPITOR) 40 MG tablet; Take 1 tablet (40 mg total) by mouth daily.  I have discontinued Delman Kitten "Frank"'s glimepiride and aspirin. I have also changed his atorvastatin and metFORMIN. Additionally, I am having him start on terbinafine and dapagliflozin propanediol. Lastly, I am having him maintain his lamoTRIgine, amLODipine, Coenzyme Q10, valsartan, Flovent HFA, albuterol, metoprolol succinate, Dupixent, Xhance, and montelukast. We will stop administering dupilumab.  Meds ordered this encounter  Medications   terbinafine (LAMISIL) 250  MG tablet    Sig: Take 1 tablet (250 mg total) by mouth daily.    Dispense:  90 tablet    Refill:  0   atorvastatin (LIPITOR) 40 MG tablet    Sig: Take 1 tablet (40 mg total) by mouth daily.    Dispense:  90 tablet    Refill:  1   metFORMIN (GLUCOPHAGE) 1000 MG tablet    Sig: Take 1 tablet (1,000 mg total) by mouth 2 (two) times daily with a meal.    Dispense:  180 tablet    Refill:  1   dapagliflozin propanediol (FARXIGA) 10 MG TABS tablet    Sig: Take 1 tablet (10 mg total) by mouth daily before breakfast.    Dispense:  90 tablet    Refill:  1      Follow-up: Return in about 6 months (around 09/08/2021).  Scarlette Calico, MD

## 2021-03-08 NOTE — Patient Instructions (Signed)

## 2021-03-09 ENCOUNTER — Ambulatory Visit (INDEPENDENT_AMBULATORY_CARE_PROVIDER_SITE_OTHER): Payer: Managed Care, Other (non HMO) | Admitting: Allergy and Immunology

## 2021-03-09 ENCOUNTER — Encounter: Payer: Self-pay | Admitting: Allergy and Immunology

## 2021-03-09 VITALS — BP 108/62 | HR 83 | Temp 98.0°F | Resp 16

## 2021-03-09 DIAGNOSIS — J4530 Mild persistent asthma, uncomplicated: Secondary | ICD-10-CM

## 2021-03-09 DIAGNOSIS — J330 Polyp of nasal cavity: Secondary | ICD-10-CM | POA: Diagnosis not present

## 2021-03-09 DIAGNOSIS — J3089 Other allergic rhinitis: Secondary | ICD-10-CM | POA: Diagnosis not present

## 2021-03-09 NOTE — Progress Notes (Signed)
[facility]   Follow-up Note  Referring Provider: Hulan Fess, MD Primary Provider: Janith Lima, MD Date of Office Visit: 03/09/2021  Subjective:   Frank Potter (DOB: Sep 17, 1960) is a 60 y.o. male who returns to the [facility] on 03/09/2021 in re-evaluation of the following:  HPI: Frank Potter returns to this clinic in evaluation of his nasal polyposis and asthma and allergic rhinitis.  His last visit to this clinic was for March 2022.  During his last visit we started him on dupilumab injections and this has been a "miracle drug" for him.  His nose is not congested and he can smell at this point.  This is the first time in 15 years he has been able to smell.  He has been able to discontinue his nasal steroids although he does remain on montelukast.  He has had no need to use any albuterol.  Allergies as of 03/09/2021       Reactions   Bee Venom Swelling   Lac Bovis    Other reaction(s): Abdominal Pain Milk        Medication List    albuterol 108 (90 Base) MCG/ACT inhaler Commonly known as: ProAir HFA Inhale 2 puffs into the lungs every 4 (four) hours as needed for wheezing or shortness of breath.   amLODipine 5 MG tablet Commonly known as: NORVASC Take by mouth.   atorvastatin 40 MG tablet Commonly known as: LIPITOR Take 1 tablet (40 mg total) by mouth daily.   Coenzyme Q10 50 MG Caps Take by mouth.   dapagliflozin propanediol 10 MG Tabs tablet Commonly known as: Farxiga Take 1 tablet (10 mg total) by mouth daily before breakfast.   Dupixent 300 MG/2ML prefilled syringe Generic drug: dupilumab Inject into the skin.   lamoTRIgine 200 MG tablet Commonly known as: LAMICTAL   metFORMIN 1000 MG tablet Commonly known as: GLUCOPHAGE Take 1 tablet (1,000 mg total) by mouth 2 (two) times daily with a meal.   metoprolol succinate 25 MG 24 hr tablet Commonly known as: TOPROL-XL Take 25 mg by  mouth daily.   montelukast 10 MG tablet Commonly known as: SINGULAIR Take 1 tablet (10 mg total) by mouth at bedtime.   terbinafine 250 MG tablet Commonly known as: LamISIL Take 1 tablet (250 mg total) by mouth daily.   valsartan 80 MG tablet Commonly known as: DIOVAN Take 80 mg by mouth daily.        Past Medical History:  Diagnosis Date   Allergic rhinitis    Diabetes mellitus without complication (H[facility])    GERD (gastroesophageal reflux disease)    HTN (hypertension)    Hyperlipidemia    Insulin resistance    Wt loss helped (55 lb wt loss)   Moderate persistent asthma 10/04/2016   Seizure disorder ([place])    Post-traumatic; has been off meds and seizure-free since 06/1996.    Past Surgical History:  Procedure Laterality Date   COLONOSCOPY  2010   Normal per pt   SINOSCOPY      Review of systems negative except as noted in HPI / PMHx or noted below:  Review of Systems  Constitutional: Negative.   HENT: Negative.    Eyes: Negative.   Respiratory: Negative.    Cardiovascular: Negative.   Gastrointestinal: Negative.   Genitourinary: Negative.   Musculoskeletal: Negative.   Skin: Negative.   Neurological: Negative.   Endo/Heme/Allergies: Negative.   Psychiatric/Behavioral: Negative.  Objective:   Vitals:   03/09/21 1605  BP: 108/62  Pulse: 83  Resp: 16  Temp: 98 F (36.7 C)  SpO2: 96%          Physical Exam Constitutional:      Appearance: He is not diaphoretic.  HENT:     Head: Normocephalic.     Right Ear: Tympanic membrane, ear canal and external ear normal.     Left Ear: Tympanic membrane, ear canal and external ear normal.     Nose: Mucosal edema (Minimal nasal polyps left) present. No rhinorrhea.     Mouth/Throat:     Pharynx: Uvula midline. No oropharyngeal exudate.  Eyes:     Conjunctiva/sclera: Conjunctivae normal.  Neck:     Thyroid: No thyromegaly.     Trachea: Trachea normal. No tracheal tenderness or tracheal deviation.   Cardiovascular:     Rate and Rhythm: Normal rate and regular rhythm.     Heart sounds: Normal heart sounds, S1 normal and S2 normal. No murmur heard. Pulmonary:     Effort: No respiratory distress.     Breath sounds: Normal breath sounds. No stridor. No wheezing or rales.  Lymphadenopathy:     Head:     Right side of head: No tonsillar adenopathy.     Left side of head: No tonsillar adenopathy.     Cervical: No cervical adenopathy.  Skin:    Findings: No erythema or rash.     Nails: There is no clubbing.  Neurological:     Mental Status: He is alert.    Diagnostics: none  Assessment and Plan:   1. Polyp of nasal cavity   2. Asthma, well controlled, mild persistent   3. Perennial allergic rhinitis     1.  Continue Dupixent every 2 weeks   2.  Continue montelukast 10 mg tablet 1 time per day  3.  Continue albuterol HFA 2 inhalations every 4-6 hours if needed  4.  Continue XHANCE or Flonase 2 sprays each nostril twice a day if needed  5.  Return to clinic in 6 months or earlier if problem  6. Obtain fall flu vaccine  Frank Potter has really done well with the use of dupilumab and we will continue him on this anti-IL-4/IL-13 biologic agent and he has a selection of other medications that he can use should they be required.  Assuming he does well with this plan I will see him back in his clinic in 6 months or earlier if there is a problem.  Allena Katz, MD Allergy / Immunology [place]

## 2021-03-09 NOTE — Patient Instructions (Signed)
  1.  Continue Dupixent every 2 weeks   2.  Continue montelukast 10 mg tablet 1 time per day  3.  Continue albuterol HFA 2 inhalations every 4-6 hours if needed  4.  Continue XHANCE or Flonase 2 sprays each nostril twice a day if needed  5.  Return to clinic in 6 months or earlier if problem  6. Obtain fall flu vaccine

## 2021-03-10 ENCOUNTER — Encounter: Payer: Self-pay | Admitting: Allergy and Immunology

## 2021-03-10 MED ORDER — MONTELUKAST SODIUM 10 MG PO TABS
10.0000 mg | ORAL_TABLET | Freq: Every day | ORAL | 0 refills | Status: DC
Start: 2021-03-10 — End: 2021-07-26

## 2021-03-10 MED ORDER — ALBUTEROL SULFATE HFA 108 (90 BASE) MCG/ACT IN AERS: 2.0000 | INHALATION_SPRAY | RESPIRATORY_TRACT | 1 refills | Status: DC | PRN

## 2021-03-10 MED ORDER — XHANCE 93 MCG/ACT NA EXHU
2.0000 | INHALANT_SUSPENSION | Freq: Two times a day (BID) | NASAL | 5 refills | Status: DC
Start: 2021-03-10 — End: 2021-09-21

## 2021-04-08 ENCOUNTER — Telehealth: Payer: Self-pay | Admitting: Internal Medicine

## 2021-04-08 ENCOUNTER — Other Ambulatory Visit: Payer: Self-pay | Admitting: Internal Medicine

## 2021-04-08 DIAGNOSIS — I10 Essential (primary) hypertension: Secondary | ICD-10-CM

## 2021-04-08 MED ORDER — METOPROLOL SUCCINATE ER 25 MG PO TB24: 25.0000 mg | ORAL_TABLET | Freq: Every day | ORAL | 1 refills | Status: DC

## 2021-04-08 NOTE — Telephone Encounter (Signed)
1.Medication Requested: metoprolol succinate (TOPROL-XL) 25 MG 24 hr tablet  2. Pharmacy (Name, Street, [place]): [facility], [place]  Phone:  [phone] Fax:  [phone]   3. On Med List: yes  4. Last Visit with PCP: 07.11.22  5. Next visit date with PCP: 10.31.22  **Patient says insurance will only cover 90DS**   Agent: Please be advised that RX refills may take up to 3 business days. We ask that you follow-up with your pharmacy.

## 2021-04-13 ENCOUNTER — Telehealth: Payer: Self-pay

## 2021-04-13 ENCOUNTER — Other Ambulatory Visit: Payer: Self-pay | Admitting: Internal Medicine

## 2021-04-13 DIAGNOSIS — I10 Essential (primary) hypertension: Secondary | ICD-10-CM

## 2021-04-13 MED ORDER — VALSARTAN 80 MG PO TABS: 80.0000 mg | ORAL_TABLET | Freq: Every day | ORAL | 0 refills | Status: DC

## 2021-04-13 NOTE — Telephone Encounter (Signed)
1.Medication Requested: Valsartan  2. Pharmacy (Name, Street, [place]): [facility], [address]  3. On Med List: yes   4. Last Visit with PCP: 03/08/2021  5. Next visit date with PCP: 06/28/2021     Patient has been advised that RX refills may take up to 3 business days. We ask that you follow-up with your pharmacy.  Patient out stating he is completely out.

## 2021-06-01 NOTE — Progress Notes (Addendum)
Frank Potter D.[facility] [place] Phone: [phone]   Assessment and Plan:     1. Closed fracture of shaft of metatarsal bone of left foot, initial encounter 2. Left foot pain -Acute, uncertain prognosis, initial sports medicine visit - X-ray obtained in clinic.  My interpretation: Closed fracture of shaft of fifth metatarsal - Start nonweightbearing with crutches. - Continue Tylenol/NSAIDs as needed for pain control - Continue ice x24 hours and elevation - [place] LIMITED JOINT SPACE STRUCTURES LOW LEFT(NO LINKED CHARGES); Future - DG Foot Complete Left; Future    Pertinent previous records reviewed include none   Follow Up: Next Monday, 06/07/2021, for repeat left foot x-ray.  If nondisplaced at that time, could progress to weightbearing as tolerated in postop shoe for additional 5 weeks (6 weeks total from fracture event)   Subjective:   I, Frank Potter, am serving as a scribe for Dr. Glennon Mac  Chief Complaint: Left foot pain   HPI:   06/02/21 Patient is a 60 year old male presenting with left foot pain that occurred after stepping off of a curb wrong. Patient states that when he bent down to pick up some leaves and his foot popped loudly and instant pain patient locates pain to lateral L foot with swelling.   Swelling: yes Aggravates: walking, pressure  Treatments tried: Ice, rotating tylenol and Advil every four hours.   Relevant Historical Information: DM type II  Additional pertinent review of systems negative.   Current Outpatient Medications:    albuterol (PROAIR HFA) 108 (90 Base) MCG/ACT inhaler, Inhale 2 puffs into the lungs every 4 (four) hours as needed for wheezing or shortness of breath., Disp: 18 g, Rfl: 1   amLODipine (NORVASC) 5 MG tablet, Take by mouth., Disp: , Rfl:    atorvastatin (LIPITOR) 40 MG tablet, Take 1 tablet (40 mg total) by mouth daily., Disp: 90 tablet, Rfl: 1    Coenzyme Q10 50 MG CAPS, Take by mouth., Disp: , Rfl:    dapagliflozin propanediol (FARXIGA) 10 MG TABS tablet, Take 1 tablet (10 mg total) by mouth daily before breakfast., Disp: 90 tablet, Rfl: 1   DUPIXENT 300 MG/2ML prefilled syringe, Inject into the skin., Disp: , Rfl:    Fluticasone Propionate (XHANCE) 93 MCG/ACT EXHU, Place 2 sprays into the nose 2 (two) times daily., Disp: 32 mL, Rfl: 5   lamoTRIgine (LAMICTAL) 200 MG tablet, , Disp: , Rfl:    metFORMIN (GLUCOPHAGE) 1000 MG tablet, Take 1 tablet (1,000 mg total) by mouth 2 (two) times daily with a meal., Disp: 180 tablet, Rfl: 1   metoprolol succinate (TOPROL-XL) 25 MG 24 hr tablet, Take 1 tablet (25 mg total) by mouth daily., Disp: 90 tablet, Rfl: 1   montelukast (SINGULAIR) 10 MG tablet, Take 1 tablet (10 mg total) by mouth at bedtime., Disp: 90 tablet, Rfl: 0   terbinafine (LAMISIL) 250 MG tablet, Take 1 tablet (250 mg total) by mouth daily., Disp: 90 tablet, Rfl: 0   valsartan (DIOVAN) 80 MG tablet, Take 1 tablet (80 mg total) by mouth daily., Disp: 90 tablet, Rfl: 0   Objective:     Vitals:   06/02/21 0831  BP: 120/70  Pulse: 61  SpO2: 98%  Weight: 210 lb (95.3 kg)  Height: 6\' 2"  (1.88 m)      Body mass index is 26.96 kg/m.    Physical Exam:    Gen: Appears well, nad, nontoxic and pleasant Psych:  Alert and oriented, appropriate mood and affect Neuro: sensation intact, strength is 5/5 with df/pf/inv/ev, muscle tone wnl Skin: no susupicious lesions or rashes  Left foot and ankle: no deformity, no swelling or effusion TTP significantly over fifth metatarsal NTTP over fibular head, lat mal, medial mal, achilles, navicular, ATFL, CFL, deltoid, calcaneous or midfoot Ankle ROM DF 30, PF 45, inv/ev intact Negative ant drawer, talar tilt, rotation test, squeeze test. Neg thompson No pain with resisted inversion or eversion    Electronically signed by:  Frank Potter D.[facility] 9:11 AM 06/02/21

## 2021-06-02 ENCOUNTER — Ambulatory Visit: Payer: Self-pay

## 2021-06-02 ENCOUNTER — Telehealth: Payer: Self-pay

## 2021-06-02 ENCOUNTER — Ambulatory Visit (INDEPENDENT_AMBULATORY_CARE_PROVIDER_SITE_OTHER): Payer: Self-pay

## 2021-06-02 ENCOUNTER — Other Ambulatory Visit: Payer: Self-pay

## 2021-06-02 ENCOUNTER — Ambulatory Visit (INDEPENDENT_AMBULATORY_CARE_PROVIDER_SITE_OTHER): Payer: Self-pay | Admitting: Sports Medicine

## 2021-06-02 VITALS — BP 120/70 | HR 61 | Ht 74.0 in | Wt 210.0 lb

## 2021-06-02 DIAGNOSIS — M79672 Pain in left foot: Secondary | ICD-10-CM

## 2021-06-02 DIAGNOSIS — S92302A Fracture of unspecified metatarsal bone(s), left foot, initial encounter for closed fracture: Secondary | ICD-10-CM

## 2021-06-02 NOTE — Telephone Encounter (Signed)
Pt is requesting a handicap form to take to the [facility]. Could you do this and contact pt or let us know when the form is ready for [place] to contact the patient?  Thank you.

## 2021-06-02 NOTE — Telephone Encounter (Signed)
Patient is aware. He will pick this up when they are here for his appointment on Monday.

## 2021-06-02 NOTE — Patient Instructions (Addendum)
Good to see you  Post op shoe and crutches given to be non weight bearing  Alternate tylenol and ibuprofen for pain control Elevate the foot  See me again on Monday for repeat x rays

## 2021-06-07 ENCOUNTER — Ambulatory Visit (INDEPENDENT_AMBULATORY_CARE_PROVIDER_SITE_OTHER): Payer: Self-pay

## 2021-06-07 ENCOUNTER — Ambulatory Visit: Payer: Self-pay | Admitting: Sports Medicine

## 2021-06-07 ENCOUNTER — Other Ambulatory Visit: Payer: Self-pay

## 2021-06-07 VITALS — BP 100/60 | HR 73 | Ht 74.0 in | Wt 211.0 lb

## 2021-06-07 DIAGNOSIS — S92302A Fracture of unspecified metatarsal bone(s), left foot, initial encounter for closed fracture: Secondary | ICD-10-CM

## 2021-06-07 DIAGNOSIS — S92302G Fracture of unspecified metatarsal bone(s), left foot, subsequent encounter for fracture with delayed healing: Secondary | ICD-10-CM

## 2021-06-07 NOTE — Progress Notes (Signed)
Frank Potter D.[facility] Phone: [phone]   Assessment and Plan:     1. Closed fracture of shaft of metatarsal bone of left foot with delayed healing, subsequent encounter -Acute, mild worsening, subsequent visit - X-ray obtained in clinic.  My interpretation: Fracture of fifth metatarsal shaft with resorption versus mild increase in displacement - Stressed the importance of nonweightbearing with crutches.  May transition to weightbearing as tolerated without crutches over the next 1 to 2 weeks with pain-free weightbearing - Continue to use postop shoe with all ambulation until follow-up - Tylenol/NSAIDs as needed for pain control - DG Foot Complete Left; Future    Pertinent previous records reviewed include previous office visit, previous x-ray   Follow Up: In 5 weeks for reevaluation   Subjective:   I, Frank Potter, am serving as a scribe for Dr. Glennon Mac  Chief Complaint: Left foot injury   HPI:   06/02/21 Patient is a 60 year old male presenting with left foot pain that occurred after stepping off of a curb wrong. Patient states that when he bent down to pick up some leaves and his foot popped loudly and instant pain patient locates pain to lateral L foot with swelling.    Swelling: yes Aggravates: walking, pressure  Treatments tried: Ice, rotating tylenol and Advil every four hours.   06/07/21 Patient states that he is doing better today, states that the swelling and pain has decreased. Patient states that the pian in his foot is mostly when he is up too long. Patient is continuing to wear the post op shoe and use the crutches. Patient [place].  Patient mentioned that he has not been completely compliant with crutches including doing yard work without them over the weekend.  States he has had mild discomfort with  weightbearing  Relevant Historical Information: DM type II  Additional pertinent review of systems negative.   Current Outpatient Medications:    albuterol (PROAIR HFA) 108 (90 Base) MCG/ACT inhaler, Inhale 2 puffs into the lungs every 4 (four) hours as needed for wheezing or shortness of breath., Disp: 18 g, Rfl: 1   amLODipine (NORVASC) 5 MG tablet, Take by mouth., Disp: , Rfl:    atorvastatin (LIPITOR) 40 MG tablet, Take 1 tablet (40 mg total) by mouth daily., Disp: 90 tablet, Rfl: 1   Coenzyme Q10 50 MG CAPS, Take by mouth., Disp: , Rfl:    dapagliflozin propanediol (FARXIGA) 10 MG TABS tablet, Take 1 tablet (10 mg total) by mouth daily before breakfast., Disp: 90 tablet, Rfl: 1   DUPIXENT 300 MG/2ML prefilled syringe, Inject into the skin., Disp: , Rfl:    Fluticasone Propionate (XHANCE) 93 MCG/ACT EXHU, Place 2 sprays into the nose 2 (two) times daily., Disp: 32 mL, Rfl: 5   lamoTRIgine (LAMICTAL) 200 MG tablet, , Disp: , Rfl:    metFORMIN (GLUCOPHAGE) 1000 MG tablet, Take 1 tablet (1,000 mg total) by mouth 2 (two) times daily with a meal., Disp: 180 tablet, Rfl: 1   metoprolol succinate (TOPROL-XL) 25 MG 24 hr tablet, Take 1 tablet (25 mg total) by mouth daily., Disp: 90 tablet, Rfl: 1   montelukast (SINGULAIR) 10 MG tablet, Take 1 tablet (10 mg total) by mouth at bedtime., Disp: 90 tablet, Rfl: 0   terbinafine (LAMISIL) 250 MG tablet, Take 1 tablet (250 mg total) by mouth daily., Disp:  90 tablet, Rfl: 0   valsartan (DIOVAN) 80 MG tablet, Take 1 tablet (80 mg total) by mouth daily., Disp: 90 tablet, Rfl: 0   Objective:     Vitals:   06/07/21 1350  BP: 100/60  Pulse: 73  SpO2: 94%  Weight: 211 lb (95.7 kg)  Height: 6\' 2"  (1.88 m)      Body mass index is 27.09 kg/m.    Physical Exam:    Gen: Appears well, nad, nontoxic and pleasant Psych: Alert and oriented, appropriate mood and affect Neuro: sensation intact, strength is 5/5 with df/pf/inv/ev, muscle tone wnl Skin: no  susupicious lesions or rashes   Left foot and ankle: no deformity, no swelling or effusion TTP significantly over fifth metatarsal NTTP over fibular head, lat mal, medial mal, achilles, navicular, ATFL, CFL, deltoid, calcaneous or midfoot Ankle ROM DF 30, PF 45, inv/ev intact Negative ant drawer, talar tilt, rotation test, squeeze test. Neg thompson No pain with resisted inversion or eversion    Electronically signed by:  Frank Potter D.[facility] 3:11 PM 06/07/21

## 2021-06-07 NOTE — Patient Instructions (Addendum)
Good to see you  Use walking shoe at all times Can gradually discontinue crutches if it is pain free to walk in post op shoe See me again in 5 weeks

## 2021-06-11 ENCOUNTER — Telehealth: Payer: Self-pay | Admitting: Internal Medicine

## 2021-06-11 NOTE — Telephone Encounter (Signed)
Patient says he recently lost his insurance & can no longer afford terbinafine (LAMISIL) 250 MG tablet OOP  Is requesting alternative that may be cheaper until insurance is back active  Please call patient [phone]

## 2021-06-11 NOTE — Telephone Encounter (Signed)
Pt has been informed to check with pharmacist for price points. I stated we would not know the price of cheaper alternatives with or without insurance. Pt was frustrated that he is being passed back and fourth as he mentioned that the pharmacy told him to call PCP. I suggested that I could leave a few GoodRx cards up front for her to assist with savings which he agreed. They have been placed up front for pick up. Pt stated that he would send his wife by due to him being on crutches for a broken small toe and pending surgery.

## 2021-06-28 ENCOUNTER — Encounter: Payer: Managed Care, Other (non HMO) | Admitting: Internal Medicine

## 2021-07-09 NOTE — Progress Notes (Addendum)
Frank Potter D.[place] [facility] Phone: [phone]   Assessment and Plan:     1. Closed fracture of shaft of metatarsal bone of left foot with delayed healing, subsequent encounter -Subacute, improving, subsequent visit - Repeat x-ray obtained due to continued pain despite >6 weeks from injury.  My interpretation: Nonhealed fracture of fifth metatarsal shaft.  Mild bone resorption since last year.  No displacement. -Delayed healing of fifth metatarsal shaft fracture without displacement likely due to DM type II and patient's activity level - Stressed the importance of using postop shoe when ambulatory, relative rest, elevation, adherence to diabetic medications including his metformin  Pertinent previous records reviewed include none   Follow Up: 4 weeks for repeat x-ray   Subjective:   I, Frank Potter, am serving as a scribe for Dr. Glennon Potter  Chief Complaint: Left foot injury follow up  HPI:   06/02/21 Patient is a 60 year old male presenting with left foot pain that occurred after stepping off of a curb wrong. Patient states that when he bent down to pick up some leaves and his foot popped loudly and instant pain patient locates pain to lateral L foot with swelling.    Swelling: yes Aggravates: walking, pressure  Treatments tried: Ice, rotating tylenol and Advil every four hours.    06/07/21 Patient states that he is doing better today, states that the swelling and pain has decreased. Patient states that the pian in his foot is mostly when he is up too long. Patient is continuing to wear the post op shoe and use the crutches. Patient states that [place] the [place] and went [place] crutches.  Patient mentioned that he has not been completely compliant with crutches including doing yard work without them over the weekend.  States he has had mild discomfort with  weightbearing  07/12/21 Patient states that his foot is still sore but he has been weight bearing because he had to seed his yard and couldn't find anyone to help him. Since he didn't want that money to go to waste he had to be out in the yard every day for the last couple weeks. Patient knows he was supposed to be non weight bearing for 7 weeks and that by not doing so he has prolonged his recovery.   Relevant Historical Information: DM type II  Additional pertinent review of systems negative.   Current Outpatient Medications:    albuterol (PROAIR HFA) 108 (90 Base) MCG/ACT inhaler, Inhale 2 puffs into the lungs every 4 (four) hours as needed for wheezing or shortness of breath., Disp: 18 g, Rfl: 1   amLODipine (NORVASC) 5 MG tablet, Take by mouth., Disp: , Rfl:    atorvastatin (LIPITOR) 40 MG tablet, Take 1 tablet (40 mg total) by mouth daily., Disp: 90 tablet, Rfl: 1   Coenzyme Q10 50 MG CAPS, Take by mouth., Disp: , Rfl:    dapagliflozin propanediol (FARXIGA) 10 MG TABS tablet, Take 1 tablet (10 mg total) by mouth daily before breakfast., Disp: 90 tablet, Rfl: 1   DUPIXENT 300 MG/2ML prefilled syringe, Inject into the skin., Disp: , Rfl:    Fluticasone Propionate (XHANCE) 93 MCG/ACT EXHU, Place 2 sprays into the nose 2 (two) times daily., Disp: 32 mL, Rfl: 5   metFORMIN (GLUCOPHAGE) 1000 MG tablet, Take 1 tablet (1,000 mg total) by mouth 2 (two) times daily with a meal., Disp: 180 tablet, Rfl: 1  metoprolol succinate (TOPROL-XL) 25 MG 24 hr tablet, Take 1 tablet (25 mg total) by mouth daily., Disp: 90 tablet, Rfl: 1   montelukast (SINGULAIR) 10 MG tablet, Take 1 tablet (10 mg total) by mouth at bedtime., Disp: 90 tablet, Rfl: 0   terbinafine (LAMISIL) 250 MG tablet, Take 1 tablet (250 mg total) by mouth daily., Disp: 90 tablet, Rfl: 0   valsartan (DIOVAN) 80 MG tablet, Take 1 tablet by mouth once daily, Disp: 90 tablet, Rfl: 0   lamoTRIgine (LAMICTAL) 200 MG tablet, , Disp: , Rfl:     Objective:     Vitals:   07/12/21 0805  BP: 110/70  Pulse: (!) 57  SpO2: 98%  Height: 6\' 2"  (1.88 m)      Body mass index is 27.09 kg/m.    Physical Exam:    Gen: Appears well, nad, nontoxic and pleasant Psych: Alert and oriented, appropriate mood and affect Neuro: sensation intact, strength is 5/5 with df/pf/inv/ev, muscle tone wnl Skin: no susupicious lesions or rashes  Left foot/Ankle: no deformity, no swelling or effusion TTP fifth metatarsal over fracture site NTTP over fibular head, lat mal, medial mal, achilles, navicular, base of 5th, ATFL, CFL, deltoid, calcaneous or midfoot ROM DF 30, PF 45, inv/ev intact Negative ant drawer, talar tilt, rotation test, squeeze test. No pain with resisted inversion or eversion    Electronically signed by:  Frank Potter D.[facility] 8:27 AM 07/12/21

## 2021-07-11 ENCOUNTER — Other Ambulatory Visit: Payer: Self-pay | Admitting: Internal Medicine

## 2021-07-11 DIAGNOSIS — I10 Essential (primary) hypertension: Secondary | ICD-10-CM

## 2021-07-12 ENCOUNTER — Other Ambulatory Visit: Payer: Self-pay

## 2021-07-12 ENCOUNTER — Ambulatory Visit (INDEPENDENT_AMBULATORY_CARE_PROVIDER_SITE_OTHER): Payer: Self-pay

## 2021-07-12 ENCOUNTER — Other Ambulatory Visit: Payer: Self-pay | Admitting: Sports Medicine

## 2021-07-12 ENCOUNTER — Ambulatory Visit: Payer: Self-pay | Admitting: Sports Medicine

## 2021-07-12 VITALS — BP 110/70 | HR 57 | Ht 74.0 in

## 2021-07-12 DIAGNOSIS — S92302G Fracture of unspecified metatarsal bone(s), left foot, subsequent encounter for fracture with delayed healing: Secondary | ICD-10-CM

## 2021-07-12 NOTE — Patient Instructions (Addendum)
Good to see you Repeat x rays next visit  Follow up in 4 weeks

## 2021-07-12 NOTE — Addendum Note (Signed)
Addended by: Glennon Mac on: 07/12/2021 08:29 AM   Modules accepted: Level of Service

## 2021-07-15 ENCOUNTER — Telehealth: Payer: Self-pay | Admitting: Internal Medicine

## 2021-07-15 ENCOUNTER — Other Ambulatory Visit: Payer: Self-pay

## 2021-07-15 DIAGNOSIS — I10 Essential (primary) hypertension: Secondary | ICD-10-CM

## 2021-07-15 MED ORDER — AMLODIPINE BESYLATE 5 MG PO TABS: 5.0000 mg | ORAL_TABLET | Freq: Every day | ORAL | 2 refills | Status: DC

## 2021-07-15 NOTE — Telephone Encounter (Signed)
Patient is out of medication, hasnt taken his dose for today: 1.Medication Requested:amLODipine (NORVASC) 5 MG tablet  2. Pharmacy (Name, Street, [place]): [facility] [place], [place] Phone:  [phone]  Fax:  [phone]      3. On Med List: Y  4. Last Visit with PCP: 7.11.22  5. Next visit date with PCP: 1.19.23   Agent: Please be advised that RX refills may take up to 3 business days. We ask that you follow-up with your pharmacy.

## 2021-07-24 ENCOUNTER — Other Ambulatory Visit: Payer: Self-pay | Admitting: Allergy and Immunology

## 2021-07-29 ENCOUNTER — Other Ambulatory Visit: Payer: Self-pay | Admitting: *Deleted

## 2021-08-06 NOTE — Progress Notes (Signed)
Frank Potter Phone: [phone]   Assessment and Plan:     1. Closed fracture of shaft of metatarsal bone of left foot with delayed healing, subsequent encounter -Subacute, adequate healing, subsequent visit - Repeat x-ray obtained today showing interval healing of fifth metatarsal shaft fracture without displacement - Feel that patient has adequately healed based on NTTP over fracture site, improved x-ray. - Fully released to restart all activities without restrictions - May discontinue postop shoe - DG Foot Complete Left; Future    Pertinent previous records reviewed include none   Follow Up: As needed if no improvement or worsening of symptoms   Subjective:    I, Frank Potter, am serving as a Education administrator for Doctor Glennon Mac  Chief Complaint: left foot injury follow up   HPI:  06/02/21 Patient is a 60 year old male presenting with left foot pain that occurred after stepping off of a curb wrong. Patient states that when he bent down to pick up some leaves and his foot popped loudly and instant pain patient locates pain to lateral L foot with swelling.    Swelling: yes Aggravates: walking, pressure  Treatments tried: Ice, rotating tylenol and Advil every four hours.    06/07/21 Patient states that he is doing better today, states that the swelling and pain has decreased. Patient states that the pian in his foot is mostly when he is up too long. Patient is continuing to wear the post op shoe and use the crutches. Patient states that on [place] he was at the [place] and went without the crutches.  Patient mentioned that he has not been completely compliant with crutches including doing yard work without them over the weekend.  States he has had mild discomfort with weightbearing   07/12/21 Patient states that his foot is still sore but he has been weight bearing because he had to seed his  yard and couldn't find anyone to help him. Since he didn't want that money to go to waste he had to be out in the yard every day for the last couple weeks. Patient knows he was supposed to be non weight bearing for 7 weeks and that by not doing so he has prolonged his recovery.    08/09/2021  Patient states that he thinks he bruised his heel, bothers him from time to time it has gotten better hasn't noticed any swelling for over a month, no need for icing has been elevating   Relevant Historical Information: DM type II    Relevant Historical Information: DM type II  Additional pertinent review of systems negative.   Current Outpatient Medications:    albuterol (PROAIR HFA) 108 (90 Base) MCG/ACT inhaler, Inhale 2 puffs into the lungs every 4 (four) hours as needed for wheezing or shortness of breath., Disp: 18 g, Rfl: 1   amLODipine (NORVASC) 5 MG tablet, Take 1 tablet (5 mg total) by mouth daily., Disp: 90 tablet, Rfl: 2   atorvastatin (LIPITOR) 40 MG tablet, Take 1 tablet (40 mg total) by mouth daily., Disp: 90 tablet, Rfl: 1   Coenzyme Q10 50 MG CAPS, Take by mouth., Disp: , Rfl:    DUPIXENT 300 MG/2ML prefilled syringe, Inject into the skin., Disp: , Rfl:    Fluticasone Propionate (XHANCE) 93 MCG/ACT EXHU, Place 2 sprays into the nose 2 (two) times daily., Disp: 32 mL, Rfl: 5   lamoTRIgine (LAMICTAL) 200 MG tablet, , Disp: ,  Rfl:    metFORMIN (GLUCOPHAGE) 1000 MG tablet, Take 1 tablet (1,000 mg total) by mouth 2 (two) times daily with a meal., Disp: 180 tablet, Rfl: 1   metoprolol succinate (TOPROL-XL) 25 MG 24 hr tablet, Take 1 tablet (25 mg total) by mouth daily., Disp: 90 tablet, Rfl: 1   montelukast (SINGULAIR) 10 MG tablet, TAKE 1 TABLET BY MOUTH AT BEDTIME, Disp: 90 tablet, Rfl: 1   valsartan (DIOVAN) 80 MG tablet, Take 1 tablet by mouth once daily, Disp: 90 tablet, Rfl: 0   dapagliflozin propanediol (FARXIGA) 10 MG TABS tablet, Take 1 tablet (10 mg total) by mouth daily before  breakfast., Disp: 90 tablet, Rfl: 1   terbinafine (LAMISIL) 250 MG tablet, Take 1 tablet (250 mg total) by mouth daily., Disp: 90 tablet, Rfl: 0   Objective:     Vitals:   08/09/21 1259  BP: 110/80  Pulse: 70  SpO2: 97%  Weight: 216 lb (98 kg)  Height: 6\' 2"  (1.88 m)      Body mass index is 27.73 kg/m.    Physical Exam:    Gen: Appears well, nad, nontoxic and pleasant Psych: Alert and oriented, appropriate mood and affect Neuro: sensation intact, strength is 5/5 with df/pf/inv/ev, muscle tone wnl Skin: no susupicious lesions or rashes  Left foot and ankle: no deformity, no swelling or effusion NTTP over fifth metatarsal shaft at site of fracture, Fibular head, lat mal, medial mal, achilles, navicular, base of 5th, ATFL, CFL, deltoid, calcaneous or midfoot Ankle ROM DF 30, PF 45, inv/ev intact Negative ant drawer, talar tilt, rotation test, squeeze test. No pain with resisted inversion or eversion    Electronically signed by:  Frank Mccreedy D.[facility] 1:35 PM 08/09/21

## 2021-08-09 ENCOUNTER — Ambulatory Visit (INDEPENDENT_AMBULATORY_CARE_PROVIDER_SITE_OTHER): Payer: Self-pay | Admitting: Sports Medicine

## 2021-08-09 ENCOUNTER — Ambulatory Visit (INDEPENDENT_AMBULATORY_CARE_PROVIDER_SITE_OTHER): Payer: Self-pay

## 2021-08-09 ENCOUNTER — Other Ambulatory Visit: Payer: Self-pay

## 2021-08-09 VITALS — BP 110/80 | HR 70 | Ht 74.0 in | Wt 216.0 lb

## 2021-08-09 DIAGNOSIS — S92302G Fracture of unspecified metatarsal bone(s), left foot, subsequent encounter for fracture with delayed healing: Secondary | ICD-10-CM

## 2021-08-09 NOTE — Patient Instructions (Addendum)
Good to see you  Can come out of boot  As needed follow up

## 2021-09-16 ENCOUNTER — Other Ambulatory Visit: Payer: Self-pay

## 2021-09-16 ENCOUNTER — Encounter: Payer: Self-pay | Admitting: Internal Medicine

## 2021-09-16 ENCOUNTER — Ambulatory Visit (INDEPENDENT_AMBULATORY_CARE_PROVIDER_SITE_OTHER): Payer: 59 | Admitting: Internal Medicine

## 2021-09-16 VITALS — BP 122/78 | HR 64 | Temp 98.0°F | Ht 74.0 in | Wt 220.0 lb

## 2021-09-16 DIAGNOSIS — Z0001 Encounter for general adult medical examination with abnormal findings: Secondary | ICD-10-CM

## 2021-09-16 DIAGNOSIS — I10 Essential (primary) hypertension: Secondary | ICD-10-CM | POA: Diagnosis not present

## 2021-09-16 DIAGNOSIS — Z0000 Encounter for general adult medical examination without abnormal findings: Secondary | ICD-10-CM

## 2021-09-16 DIAGNOSIS — Z125 Encounter for screening for malignant neoplasm of prostate: Secondary | ICD-10-CM

## 2021-09-16 DIAGNOSIS — E119 Type 2 diabetes mellitus without complications: Secondary | ICD-10-CM | POA: Diagnosis not present

## 2021-09-16 DIAGNOSIS — E785 Hyperlipidemia, unspecified: Secondary | ICD-10-CM | POA: Diagnosis not present

## 2021-09-16 DIAGNOSIS — Z1159 Encounter for screening for other viral diseases: Secondary | ICD-10-CM

## 2021-09-16 DIAGNOSIS — Z23 Encounter for immunization: Secondary | ICD-10-CM | POA: Diagnosis not present

## 2021-09-16 LAB — CBC WITH DIFFERENTIAL/PLATELET
Basophils Absolute: 0 10*3/uL (ref 0.0–0.1)
Basophils Relative: 0.6 % (ref 0.0–3.0)
Eosinophils Absolute: 0.2 10*3/uL (ref 0.0–0.7)
Eosinophils Relative: 2.4 % (ref 0.0–5.0)
HCT: 43 % (ref 39.0–52.0)
Hemoglobin: 14.3 g/dL (ref 13.0–17.0)
Lymphocytes Relative: 34.4 % (ref 12.0–46.0)
Lymphs Abs: 2.5 10*3/uL (ref 0.7–4.0)
MCHC: 33.3 g/dL (ref 30.0–36.0)
MCV: 87.4 fl (ref 78.0–100.0)
Monocytes Absolute: 0.8 10*3/uL (ref 0.1–1.0)
Monocytes Relative: 10.9 % (ref 3.0–12.0)
Neutro Abs: 3.8 10*3/uL (ref 1.4–7.7)
Neutrophils Relative %: 51.7 % (ref 43.0–77.0)
Platelets: 180 10*3/uL (ref 150.0–400.0)
RBC: 4.92 Mil/uL (ref 4.22–5.81)
RDW: 13.1 % (ref 11.5–15.5)
WBC: 7.3 10*3/uL (ref 4.0–10.5)

## 2021-09-16 LAB — MICROALBUMIN / CREATININE URINE RATIO
Creatinine,U: 212.3 mg/dL
Microalb Creat Ratio: 0.5 mg/g (ref 0.0–30.0)
Microalb, Ur: 1.1 mg/dL (ref 0.0–1.9)

## 2021-09-16 LAB — HEPATITIS C ANTIBODY
Hepatitis C Ab: NONREACTIVE
SIGNAL TO CUT-OFF: 0.08 (ref <1.00)

## 2021-09-16 LAB — URINALYSIS, ROUTINE W REFLEX MICROSCOPIC
Bilirubin Urine: NEGATIVE
Hgb urine dipstick: NEGATIVE
Ketones, ur: NEGATIVE
Leukocytes,Ua: NEGATIVE
Nitrite: NEGATIVE
RBC / HPF: NONE SEEN (ref 0–?)
Specific Gravity, Urine: 1.025 (ref 1.000–1.030)
Total Protein, Urine: NEGATIVE
Urine Glucose: NEGATIVE
Urobilinogen, UA: 0.2 (ref 0.0–1.0)
pH: 5.5 (ref 5.0–8.0)

## 2021-09-16 LAB — HEPATIC FUNCTION PANEL
ALT: 44 U/L (ref 0–53)
AST: 26 U/L (ref 0–37)
Albumin: 4.4 g/dL (ref 3.5–5.2)
Alkaline Phosphatase: 67 U/L (ref 39–117)
Bilirubin, Direct: 0.1 mg/dL (ref 0.0–0.3)
Total Bilirubin: 0.8 mg/dL (ref 0.2–1.2)
Total Protein: 6.8 g/dL (ref 6.0–8.3)

## 2021-09-16 LAB — LIPID PANEL
Cholesterol: 125 mg/dL (ref 0–200)
HDL: 30.8 mg/dL — ABNORMAL LOW (ref >39.00)
LDL Cholesterol: 65 mg/dL (ref 0–99)
NonHDL: 94.03
Total CHOL/HDL Ratio: 4
Triglycerides: 147 mg/dL (ref 0.0–149.0)
VLDL: 29.4 mg/dL (ref 0.0–40.0)

## 2021-09-16 LAB — BASIC METABOLIC PANEL
BUN: 22 mg/dL (ref 6–23)
CO2: 27 mEq/L (ref 19–32)
Calcium: 9.3 mg/dL (ref 8.4–10.5)
Chloride: 104 mEq/L (ref 96–112)
Creatinine, Ser: 1.02 mg/dL (ref 0.40–1.50)
GFR: 78.77 mL/min (ref >60.00)
Glucose, Bld: 135 mg/dL — ABNORMAL HIGH (ref 70–99)
Potassium: 4.8 mEq/L (ref 3.5–5.1)
Sodium: 140 mEq/L (ref 135–145)

## 2021-09-16 LAB — HIV ANTIBODY (ROUTINE TESTING W REFLEX): HIV 1&2 Ab, 4th Generation: NONREACTIVE

## 2021-09-16 LAB — PSA: PSA: 1.27 ng/mL (ref 0.10–4.00)

## 2021-09-16 LAB — HEMOGLOBIN A1C: Hgb A1c MFr Bld: 7.2 % — ABNORMAL HIGH (ref 4.6–6.5)

## 2021-09-16 LAB — TSH: TSH: 2.29 u[IU]/mL (ref 0.35–5.50)

## 2021-09-16 MED ORDER — ATORVASTATIN CALCIUM 40 MG PO TABS: 40.0000 mg | ORAL_TABLET | Freq: Every day | ORAL | 1 refills | Status: DC

## 2021-09-16 MED ORDER — METFORMIN HCL 1000 MG PO TABS: 1000.0000 mg | ORAL_TABLET | Freq: Two times a day (BID) | ORAL | 1 refills | Status: DC

## 2021-09-16 NOTE — Patient Instructions (Signed)

## 2021-09-16 NOTE — Progress Notes (Signed)
Subjective:  Patient ID: Frank Potter, male    DOB: September 06, 1960  Age: 61 y.o. MRN: [id number]  CC: Annual Exam, Diabetes, Hyperlipidemia, and Hypertension  This visit occurred during the SARS-CoV-2 public health emergency.  Safety protocols were in place, including screening questions prior to the visit, additional usage of staff PPE, and extensive cleaning of exam room while observing appropriate contact time as indicated for disinfecting solutions.    HPI Frank Potter presents for a CPX and f/up -   He is active and denies chest pain, shortness of breath, diaphoresis, dizziness, lightheadedness, or edema. He is not taking [place].   Outpatient Medications Prior to Visit  Medication Sig Dispense Refill   albuterol (PROAIR HFA) 108 (90 Base) MCG/ACT inhaler Inhale 2 puffs into the lungs every 4 (four) hours as needed for wheezing or shortness of breath. 18 g 1   amLODipine (NORVASC) 5 MG tablet Take 1 tablet (5 mg total) by mouth daily. 90 tablet 2   Coenzyme Q10 50 MG CAPS Take by mouth.     DUPIXENT 300 MG/2ML prefilled syringe Inject into the skin.     Fluticasone Propionate (XHANCE) 93 MCG/ACT EXHU Place 2 sprays into the nose 2 (two) times daily. 32 mL 5   lamoTRIgine (LAMICTAL) 200 MG tablet      metoprolol succinate (TOPROL-XL) 25 MG 24 hr tablet Take 1 tablet (25 mg total) by mouth daily. 90 tablet 1   montelukast (SINGULAIR) 10 MG tablet TAKE 1 TABLET BY MOUTH AT BEDTIME 90 tablet 1   valsartan (DIOVAN) 80 MG tablet Take 1 tablet by mouth once daily 90 tablet 0   atorvastatin (LIPITOR) 40 MG tablet Take 1 tablet (40 mg total) by mouth daily. 90 tablet 1   dapagliflozin propanediol (FARXIGA) 10 MG TABS tablet Take 1 tablet (10 mg total) by mouth daily before breakfast. 90 tablet 1   metFORMIN (GLUCOPHAGE) 1000 MG tablet Take 1 tablet (1,000 mg total) by mouth 2 (two) times daily with a meal. 180 tablet 1   terbinafine (LAMISIL) 250 MG tablet Take 1 tablet (250 mg  total) by mouth daily. 90 tablet 0   No facility-administered medications prior to visit.    ROS Review of Systems  Constitutional:  Negative for diaphoresis, fatigue and unexpected weight change.  HENT: Negative.    Eyes: Negative.   Respiratory:  Negative for cough, chest tightness, shortness of breath and wheezing.   Cardiovascular:  Negative for chest pain, palpitations and leg swelling.  Gastrointestinal:  Negative for abdominal pain, constipation, diarrhea, nausea and vomiting.  Endocrine: Negative.  Negative for polyuria.  Genitourinary: Negative.  Negative for difficulty urinating, dysuria, scrotal swelling and testicular pain.  Musculoskeletal: Negative.  Negative for arthralgias, back pain and myalgias.  Skin: Negative.  Negative for color change and pallor.  Neurological:  Negative for dizziness, weakness, light-headedness, numbness and headaches.  Hematological:  Negative for adenopathy. Does not bruise/bleed easily.  Psychiatric/Behavioral: Negative.     Objective:  BP 122/78 (BP Location: Right Arm, Patient Position: Sitting, Cuff Size: Large)    Pulse 64    Temp 98 F (36.7 C) (Oral)    Ht 6\' 2"  (1.88 m)    Wt 220 lb (99.8 kg)    SpO2 98%    BMI 28.25 kg/m   BP Readings from Last 3 Encounters:  09/16/21 122/78  08/09/21 110/80  07/12/21 110/70    Wt Readings from Last 3 Encounters:  09/16/21 220 lb (99.8 kg)  08/09/21  216 lb (98 kg)  06/07/21 211 lb (95.7 kg)    Physical Exam Vitals reviewed.  HENT:     Nose: Nose normal.     Mouth/Throat:     Mouth: Mucous membranes are moist.  Eyes:     General: No scleral icterus.    Conjunctiva/sclera: Conjunctivae normal.  Cardiovascular:     Rate and Rhythm: Normal rate and regular rhythm.     Heart sounds: No murmur heard. Pulmonary:     Effort: Pulmonary effort is normal.     Breath sounds: No stridor. No wheezing, rhonchi or rales.  Abdominal:     General: Abdomen is flat.     Palpations: There is no  mass.     Tenderness: There is no abdominal tenderness. There is no guarding or rebound.     Hernia: No hernia is present. There is no hernia in the left inguinal area or right inguinal area.  Genitourinary:    Pubic Area: No rash.      Penis: Normal and circumcised.      Testes: Normal.     Epididymis:     Right: Normal. No mass.     Left: Normal. No mass.     Prostate: Normal. Not enlarged, not tender and no nodules present.     Rectum: Guaiac result negative. No mass, tenderness, anal fissure, external hemorrhoid or internal hemorrhoid. Normal anal tone.  Musculoskeletal:        General: Normal range of motion.     Cervical back: Neck supple.     Right lower leg: No edema.     Left lower leg: No edema.  Lymphadenopathy:     Cervical: No cervical adenopathy.     Lower Body: No right inguinal adenopathy. No left inguinal adenopathy.  Skin:    General: Skin is warm and dry.     Findings: No rash.  Neurological:     General: No focal deficit present.     Mental Status: He is alert. Mental status is at baseline.  Psychiatric:        Mood and Affect: Mood normal.        Behavior: Behavior normal.    Lab Results  Component Value Date   WBC 7.3 09/16/2021   HGB 14.3 09/16/2021   HCT 43.0 09/16/2021   PLT 180.0 09/16/2021   GLUCOSE 135 (H) 09/16/2021   CHOL 125 09/16/2021   TRIG 147.0 09/16/2021   HDL 30.80 (L) 09/16/2021   LDLCALC 65 09/16/2021   ALT 44 09/16/2021   AST 26 09/16/2021   NA 140 09/16/2021   K 4.8 09/16/2021   CL 104 09/16/2021   CREATININE 1.02 09/16/2021   BUN 22 09/16/2021   CO2 27 09/16/2021   TSH 2.29 09/16/2021   PSA 1.27 09/16/2021   HGBA1C 7.2 (H) 09/16/2021   MICROALBUR 1.1 09/16/2021    CT Head Wo Contrast  Result Date: 06/27/2020 CLINICAL DATA:  Syncopal episode. EXAM: CT HEAD WITHOUT CONTRAST TECHNIQUE: Contiguous axial images were obtained from the base of the skull through the vertex without intravenous contrast. COMPARISON:  None.  FINDINGS: Brain: No evidence of acute infarction, hemorrhage, hydrocephalus, extra-axial collection or mass lesion/mass effect. Vascular: No hyperdense vessel or unexpected calcification. Skull: Normal. Negative for fracture or focal lesion. Sinuses/Orbits: There is marked severity hyperdense sphenoid sinus, bilateral ethmoid sinus and bilateral nasal mucosal thickening. Mild frontal sinus and mild bilateral maxillary sinus mucosal thickening is also seen Other: None. IMPRESSION: 1. No acute intracranial abnormality. 2.  Marked severity paranasal sinus disease, as described above. Electronically Signed   By: Virgina Norfolk M.D.   On: 06/27/2020 02:35   CT Cervical Spine Wo Contrast  Result Date: 06/27/2020 CLINICAL DATA:  Syncopal episode. EXAM: CT CERVICAL SPINE WITHOUT CONTRAST TECHNIQUE: Multidetector CT imaging of the cervical spine was performed without intravenous contrast. Multiplanar CT image reconstructions were also generated. COMPARISON:  None. FINDINGS: Alignment: Normal. Skull base and vertebrae: No acute fracture. No primary bone lesion or focal pathologic process. Soft tissues and spinal canal: No prevertebral fluid or swelling. No visible canal hematoma. Disc levels: Mild endplate sclerosis and anterior osteophyte formation is seen at the levels of C4-C5, C5-C6 and C6-C7. Mild intervertebral disc space narrowing is also seen at these levels. Mild bilateral multilevel facet joint hypertrophy is noted. Upper chest: Negative. Other: Marked severity sphenoid sinus, bilateral ethmoid sinus and bilateral nasal mucosal thickening is seen. Mild bilateral maxillary sinus mucosal thickening is also noted. IMPRESSION: 1. Mild multilevel degenerative disc disease and facet joint hypertrophy. 2. Marked severity pansinus disease. Electronically Signed   By: Virgina Norfolk M.D.   On: 06/27/2020 02:42   DG Chest Portable 1 View  Result Date: 06/27/2020 CLINICAL DATA:  Syncope EXAM: PORTABLE CHEST 1  VIEW COMPARISON:  None. FINDINGS: The heart size and mediastinal contours are within normal limits. Both lungs are clear. The visualized skeletal structures are unremarkable. IMPRESSION: No active disease. Electronically Signed   By: Fidela Salisbury MD   On: 06/27/2020 00:35   CT Maxillofacial Wo Contrast  Result Date: 06/27/2020 CLINICAL DATA:  Syncopal episode. EXAM: CT MAXILLOFACIAL WITHOUT CONTRAST TECHNIQUE: Multidetector CT imaging of the maxillofacial structures was performed. Multiplanar CT image reconstructions were also generated. COMPARISON:  None. FINDINGS: Osseous: No fracture or mandibular dislocation. No destructive process. Orbits: Negative. No traumatic or inflammatory finding. Sinuses: Marked severity sphenoid sinus, bilateral ethmoid sinus and bilateral nasal mucosal thickening is seen. Mild frontal sinus and mild bilateral maxillary sinus mucosal thickening is also noted. Soft tissues: Negative. Limited intracranial: No significant or unexpected finding. IMPRESSION: 1. Marked severity paranasal sinus disease. 2. No acute osseous abnormality. Electronically Signed   By: Virgina Norfolk M.D.   On: 06/27/2020 02:38    Assessment & Plan:   Frank Potter was seen today for annual exam, diabetes, hyperlipidemia and hypertension.  Diagnoses and all orders for this visit:  Primary hypertension- His blood pressure is adequately well controlled. -     Basic metabolic panel; Future -     Hepatic function panel; Future -     TSH; Future -     Urinalysis, Routine w reflex microscopic; Future -     CBC with Differential/Platelet; Future -     CBC with Differential/Platelet -     Urinalysis, Routine w reflex microscopic -     TSH -     Hepatic function panel -     Basic metabolic panel  Type 2 diabetes mellitus without complication, without long-term current use of insulin ([place])- His A1c is at 7.2%.  I recommended that he continue taking metformin. -     Basic metabolic panel; Future -      Hemoglobin A1c; Future -     Microalbumin / creatinine urine ratio; Future -     HM Diabetes Foot Exam -     Microalbumin / creatinine urine ratio -     Hemoglobin A1c -     Basic metabolic panel -     metFORMIN (GLUCOPHAGE) 1000 MG tablet;  Take 1 tablet (1,000 mg total) by mouth 2 (two) times daily with a meal.  Encounter for general adult medical examination with abnormal findings- Exam completed, labs reviewed, vaccines reviewed and updated, cancer screenings are up-to-date, patient education was given. -     Lipid panel; Future -     Hepatitis C antibody; Future -     HIV Antibody (routine testing w rflx); Future -     PSA; Future -     PSA -     HIV Antibody (routine testing w rflx) -     Hepatitis C antibody -     Lipid panel  Hyperlipidemia LDL goal <100- LDL goal achieved. Doing well on the statin  -     Hepatic function panel; Future -     TSH; Future -     TSH -     Hepatic function panel -     atorvastatin (LIPITOR) 40 MG tablet; Take 1 tablet (40 mg total) by mouth daily.  Need for hepatitis C screening test -     Hepatitis C antibody; Future -     Hepatitis C antibody  Need for vaccination -     Pneumococcal conjugate vaccine 20-valent  Other orders -     Flu Vaccine QUAD 6+ mos PF IM (Fluarix Quad PF)   I have discontinued Frank Kitten "Frank"'s terbinafine and dapagliflozin propanediol. I am also having him maintain his lamoTRIgine, Coenzyme Q10, Dupixent, Xhance, albuterol, metoprolol succinate, valsartan, amLODipine, montelukast, atorvastatin, and metFORMIN.  Meds ordered this encounter  Medications   atorvastatin (LIPITOR) 40 MG tablet    Sig: Take 1 tablet (40 mg total) by mouth daily.    Dispense:  90 tablet    Refill:  1   metFORMIN (GLUCOPHAGE) 1000 MG tablet    Sig: Take 1 tablet (1,000 mg total) by mouth 2 (two) times daily with a meal.    Dispense:  180 tablet    Refill:  1     Follow-up: Return in about 6 months (around  03/16/2022).  Scarlette Calico, MD

## 2021-09-21 ENCOUNTER — Ambulatory Visit: Payer: Managed Care, Other (non HMO) | Admitting: Allergy and Immunology

## 2021-09-21 ENCOUNTER — Telehealth: Payer: Self-pay | Admitting: *Deleted

## 2021-09-21 ENCOUNTER — Other Ambulatory Visit: Payer: Self-pay

## 2021-09-21 ENCOUNTER — Ambulatory Visit (INDEPENDENT_AMBULATORY_CARE_PROVIDER_SITE_OTHER): Payer: 59 | Admitting: Allergy and Immunology

## 2021-09-21 VITALS — BP 118/68 | HR 84 | Temp 98.4°F | Resp 16 | Ht 74.0 in | Wt 223.6 lb

## 2021-09-21 DIAGNOSIS — J339 Nasal polyp, unspecified: Secondary | ICD-10-CM | POA: Diagnosis not present

## 2021-09-21 DIAGNOSIS — J4530 Mild persistent asthma, uncomplicated: Secondary | ICD-10-CM | POA: Diagnosis not present

## 2021-09-21 DIAGNOSIS — J3089 Other allergic rhinitis: Secondary | ICD-10-CM | POA: Diagnosis not present

## 2021-09-21 MED ORDER — ALBUTEROL SULFATE HFA 108 (90 BASE) MCG/ACT IN AERS: 2.0000 | INHALATION_SPRAY | RESPIRATORY_TRACT | 1 refills | Status: DC | PRN

## 2021-09-21 MED ORDER — XHANCE 93 MCG/ACT NA EXHU: 2.0000 | INHALANT_SUSPENSION | Freq: Two times a day (BID) | NASAL | 11 refills | Status: AC

## 2021-09-21 MED ORDER — MONTELUKAST SODIUM 10 MG PO TABS: 10.0000 mg | ORAL_TABLET | Freq: Every day | ORAL | 1 refills | Status: DC

## 2021-09-21 MED ORDER — MONTELUKAST SODIUM 10 MG PO TABS: 10.0000 mg | ORAL_TABLET | Freq: Every day | ORAL | 3 refills | Status: DC

## 2021-09-21 MED ORDER — DUPILUMAB 300 MG/2ML ~~LOC~~ SOSY
300.0000 mg | PREFILLED_SYRINGE | SUBCUTANEOUS | Status: DC
Start: 2021-09-21 — End: 2021-12-22
  Administered 2021-09-21: 10:00:00 300 mg via SUBCUTANEOUS

## 2021-09-21 MED ORDER — XHANCE 93 MCG/ACT NA EXHU: 2.0000 | INHALANT_SUSPENSION | Freq: Two times a day (BID) | NASAL | 5 refills | Status: DC

## 2021-09-21 NOTE — Patient Instructions (Signed)
°  1.  Continue Dupixent every 2 weeks. Sample administered today  2.  Continue montelukast 10 mg tablet 1 time per day  3.  Continue albuterol HFA 2 inhalations every 4-6 hours if needed  4.  Continue Flonase 2 sprays each nostril twice a day if needed  5.  Return to clinic in 1 year or earlier if problem

## 2021-09-21 NOTE — Telephone Encounter (Signed)
PA has been submitted through CoverMyMeds for Xhance and is currently pending approval/denial.  

## 2021-09-21 NOTE — Progress Notes (Signed)
Immunotherapy   Patient Details  Name: Frank Potter MRN: [id number] Date of Birth: 08/01/1961  09/21/2021  Frank Potter started injections for  Dupixent 300  Frequency: Every 2 Weeks Epi-Pen: Not Required  Consent signed and patient instructions given.  Patient restarted Dupixent for Nasal Polyps and received 300mg  sample in the LUA. Patient did not experience any issues.   Frank Potter 09/21/2021, 10:02 AM

## 2021-09-21 NOTE — Telephone Encounter (Signed)
PA has been approved for Xhance, PA has been faxed to patients pharmacy, labeled, and placed in bulk scanning.  

## 2021-09-21 NOTE — Progress Notes (Signed)
[facility]   Follow-up Note  Referring Provider: Janith Lima, MD Primary Provider: Janith Lima, MD Date of Office Visit: 09/21/2021  Subjective:   Frank Potter (DOB: 07-07-1961) is a 61 y.o. male who returns to the [facility] on 09/21/2021 in re-evaluation of the following:  HPI: Frank Potter returns to this clinic in evaluation of nasal polyposis, asthma, allergic rhinitis.  I have not seen him in this clinic since 09 March 2021.  He had to discontinue his dupilumab secondary to an insurance issue in September 2022.  Fortunately, he now has insurance and he is reapplying for dupilumab.  His previous experience with dupilumab was wonderful.  It basically eliminated his nasal congestion and he could smell and he had no issues with asthma.  Fortunately, he has really done relatively well even though he has discontinued his dupilumab although his ability to smell is not as acute and he has had to use a short acting bronchodilator a few times for some wheezing recently.  He has not required a systemic steroid or antibiotic for any type of airway issue.  Allergies as of 09/21/2021       Reactions   Bee Venom Swelling, Hives   Lac Bovis    Other reaction(s): Abdominal Pain Milk Other reaction(s): serve cramps        Medication List    albuterol 108 (90 Base) MCG/ACT inhaler Commonly known as: ProAir HFA Inhale 2 puffs into the lungs every 4 (four) hours as needed for wheezing or shortness of breath.   amLODipine 5 MG tablet Commonly known as: NORVASC Take 1 tablet (5 mg total) by mouth daily.   atorvastatin 40 MG tablet Commonly known as: LIPITOR Take 1 tablet (40 mg total) by mouth daily.   Coenzyme Q10 50 MG Caps Take by mouth.   Dupixent 300 MG/2ML prefilled syringe Generic drug: dupilumab Inject into the skin.   lamoTRIgine 200 MG tablet Commonly known as: LAMICTAL   metFORMIN 1000 MG  tablet Commonly known as: GLUCOPHAGE Take 1 tablet (1,000 mg total) by mouth 2 (two) times daily with a meal.   metoprolol succinate 25 MG 24 hr tablet Commonly known as: TOPROL-XL Take 1 tablet (25 mg total) by mouth daily.   montelukast 10 MG tablet Commonly known as: SINGULAIR TAKE 1 TABLET BY MOUTH AT BEDTIME   valsartan 80 MG tablet Commonly known as: DIOVAN Take 1 tablet by mouth once daily   Xhance 93 MCG/ACT Exhu Generic drug: Fluticasone Propionate Place 2 sprays into the nose 2 (two) times daily.    Past Medical History:  Diagnosis Date   Allergic rhinitis    Diabetes mellitus without complication (HCC)    GERD (gastroesophageal reflux disease)    HTN (hypertension)    Hyperlipidemia    Insulin resistance    Wt loss helped (55 lb wt loss)   Moderate persistent asthma 10/04/2016   Seizure disorder ([place])    Post-traumatic; has been off meds and seizure-free since 06/1996.    Past Surgical History:  Procedure Laterality Date   COLONOSCOPY  2010   Normal per pt   SINOSCOPY      Review of systems negative except as noted in HPI / PMHx or noted below:  Review of Systems  Constitutional: Negative.   HENT: Negative.    Eyes: Negative.   Respiratory: Negative.    Cardiovascular: Negative.   Gastrointestinal: Negative.   Genitourinary: Negative.  Musculoskeletal: Negative.   Skin: Negative.   Neurological: Negative.   Endo/Heme/Allergies: Negative.   Psychiatric/Behavioral: Negative.      Objective:   Vitals:   09/21/21 0903  BP: 118/68  Pulse: 84  Resp: 16  Temp: 98.4 F (36.9 C)  SpO2: 98%   Height: 6\' 2"  (188 cm)  Weight: 223 lb 9.6 oz (101.4 kg)   Physical Exam Constitutional:      Appearance: He is not diaphoretic.  HENT:     Head: Normocephalic.     Right Ear: Tympanic membrane, ear canal and external ear normal.     Left Ear: Tympanic membrane, ear canal and external ear normal.     Nose: Mucosal edema (Bilateral nasal polyposis)  present. No rhinorrhea.     Mouth/Throat:     Pharynx: Uvula midline. No oropharyngeal exudate.  Eyes:     Conjunctiva/sclera: Conjunctivae normal.  Neck:     Thyroid: No thyromegaly.     Trachea: Trachea normal. No tracheal tenderness or tracheal deviation.  Cardiovascular:     Rate and Rhythm: Normal rate and regular rhythm.     Heart sounds: Normal heart sounds, S1 normal and S2 normal. No murmur heard. Pulmonary:     Effort: No respiratory distress.     Breath sounds: Normal breath sounds. No stridor. No wheezing or rales.  Lymphadenopathy:     Head:     Right side of head: No tonsillar adenopathy.     Left side of head: No tonsillar adenopathy.     Cervical: No cervical adenopathy.  Skin:    Findings: No erythema or rash.     Nails: There is no clubbing.  Neurological:     Mental Status: He is alert.    Diagnostics:    Spirometry was performed and demonstrated an FEV1 of 3.05 at 77 % of predicted.  The patient had an Asthma Control Test with the following results: ACT Total Score: 23.    Assessment and Plan:   1. Asthma, well controlled, mild persistent   2. Perennial allergic rhinitis   3. Nasal polyposis     1.  Continue Dupixent every 2 weeks. Sample administered today  2.  Continue montelukast 10 mg tablet 1 time per day  3.  Continue albuterol HFA 2 inhalations every 4-6 hours if needed  4.  Continue Flonase 2 sprays each nostril twice a day if needed  5.  Return to clinic in 1 year or earlier if problem  Frank Potter needs to use dupilumab to prevent him from redeveloping significant nasal polyposis and developing anosmia and to control his asthma.  I have given him a sample today and we will work through his insurance issue over the course the next several weeks.  I will see him back in this clinic in 1 year or earlier if there is a problem.  Allena Katz, MD Allergy / Immunology [place]

## 2021-09-22 ENCOUNTER — Encounter: Payer: Self-pay | Admitting: Allergy and Immunology

## 2021-09-23 ENCOUNTER — Telehealth: Payer: Self-pay | Admitting: *Deleted

## 2021-09-23 NOTE — Telephone Encounter (Signed)
Spoke to patient and advised approval and submit and he has copay card info to give to [facility]

## 2021-09-23 NOTE — Telephone Encounter (Signed)
L/m for patient to contact me to advised approval and submit to [facility] to restart Dupixent. Need to remind him of copay card info he has.

## 2021-10-03 ENCOUNTER — Other Ambulatory Visit: Payer: Self-pay | Admitting: Internal Medicine

## 2021-10-03 DIAGNOSIS — I10 Essential (primary) hypertension: Secondary | ICD-10-CM

## 2021-10-13 ENCOUNTER — Telehealth: Payer: Self-pay | Admitting: Internal Medicine

## 2021-10-13 NOTE — Telephone Encounter (Signed)
Patient calling in  Says pharmacy should have sent refill request for  Advised patient med no longer listed on current med list.. patient says he is not sure why bc he has still been taking med  Please clarify & refill if appropriate.. call patient [phone]

## 2021-10-14 NOTE — Telephone Encounter (Signed)
Medication is: glimepiride (AMARYL) 2 MG tablet

## 2021-10-17 ENCOUNTER — Other Ambulatory Visit: Payer: Self-pay | Admitting: Internal Medicine

## 2021-10-17 DIAGNOSIS — E119 Type 2 diabetes mellitus without complications: Secondary | ICD-10-CM

## 2021-10-17 MED ORDER — GLIMEPIRIDE 2 MG PO TABS: 2.0000 mg | ORAL_TABLET | Freq: Every day | ORAL | 0 refills | Status: DC

## 2021-12-14 ENCOUNTER — Ambulatory Visit (INDEPENDENT_AMBULATORY_CARE_PROVIDER_SITE_OTHER): Payer: 59 | Admitting: Allergy & Immunology

## 2021-12-14 ENCOUNTER — Encounter: Payer: Self-pay | Admitting: Allergy & Immunology

## 2021-12-14 VITALS — BP 146/72 | HR 75 | Temp 98.3°F | Resp 16 | Ht 74.0 in | Wt 229.8 lb

## 2021-12-14 DIAGNOSIS — J3089 Other allergic rhinitis: Secondary | ICD-10-CM

## 2021-12-14 DIAGNOSIS — J4530 Mild persistent asthma, uncomplicated: Secondary | ICD-10-CM

## 2021-12-14 DIAGNOSIS — J339 Nasal polyp, unspecified: Secondary | ICD-10-CM | POA: Diagnosis not present

## 2021-12-14 IMAGING — DX DG FOOT COMPLETE 3+V*L*
3 series · 3 of 3 positions shown · non-contrast
Comparison: 07/12/2021

CLINICAL DATA: Left foot fracture

EXAM:
LEFT FOOT - COMPLETE 3+ VIEW

[foot ap]
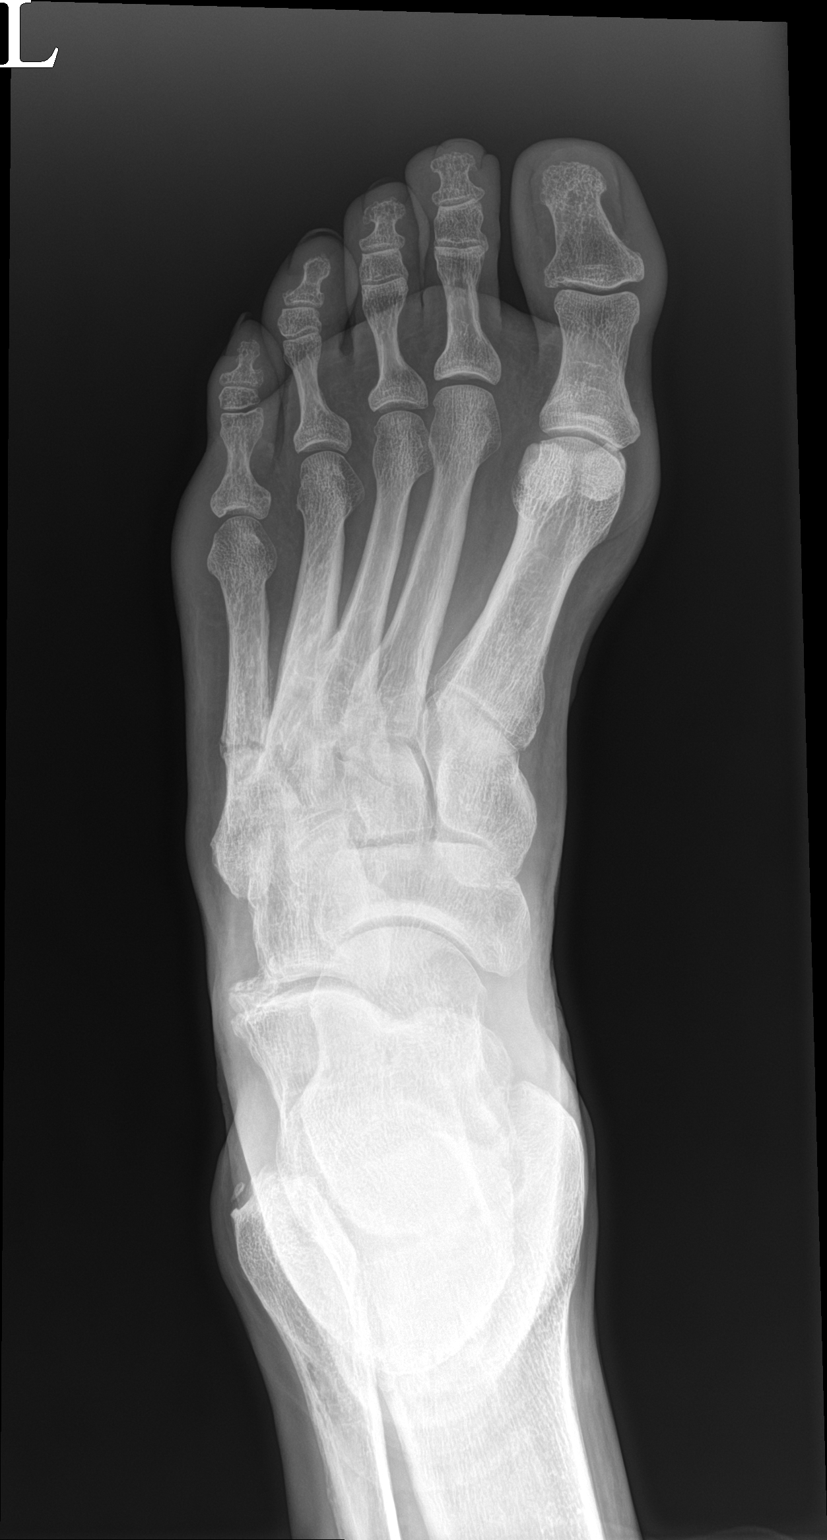

[foot obl]
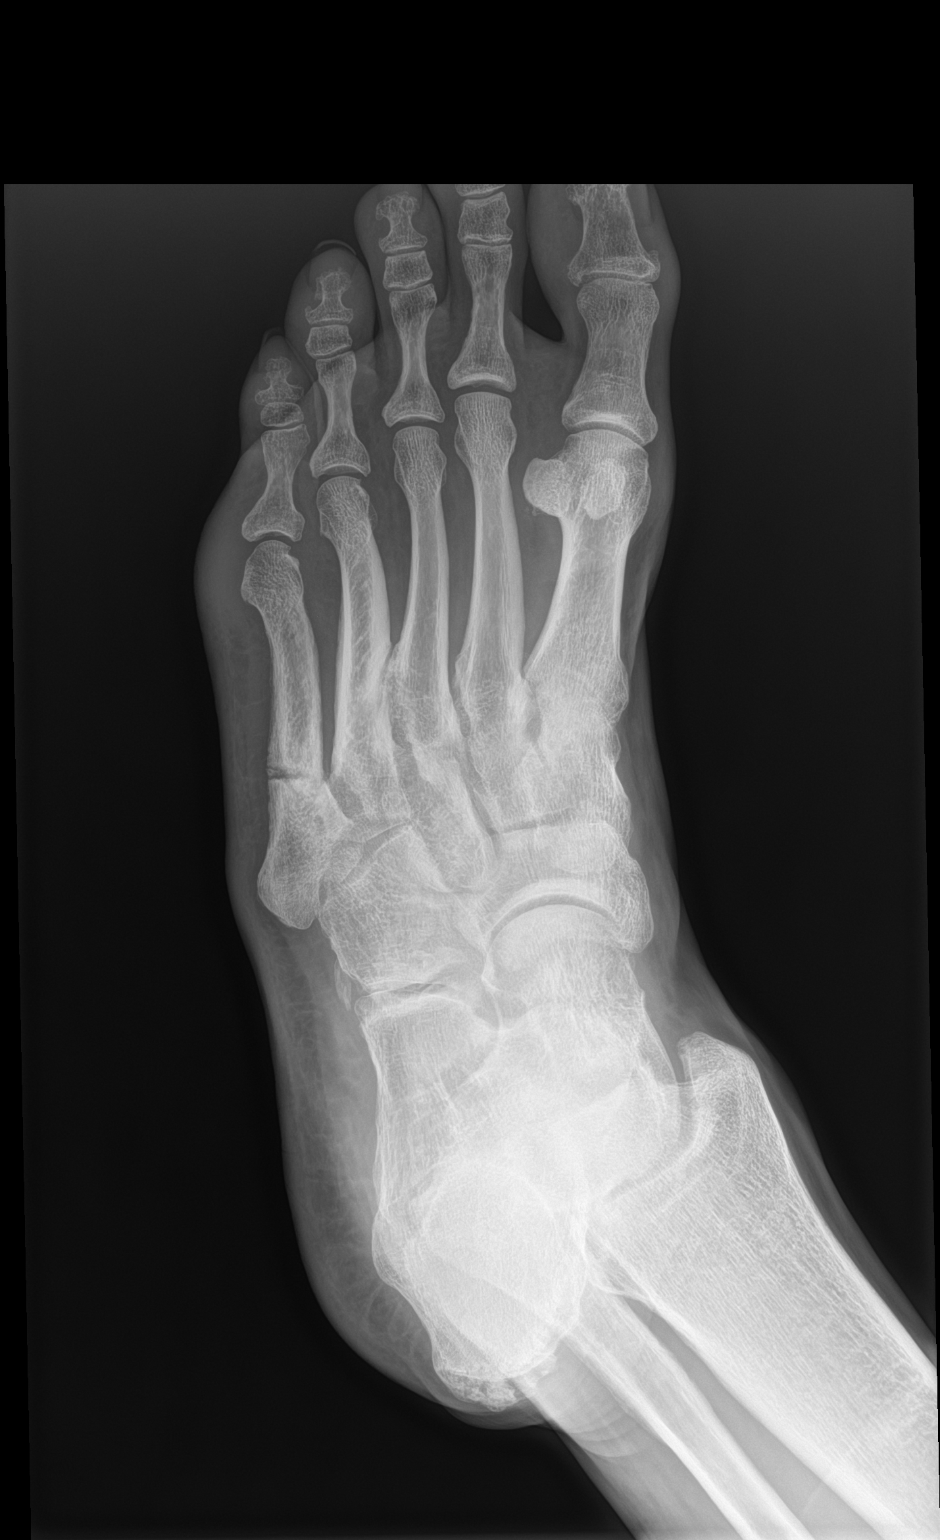

[foot lat]
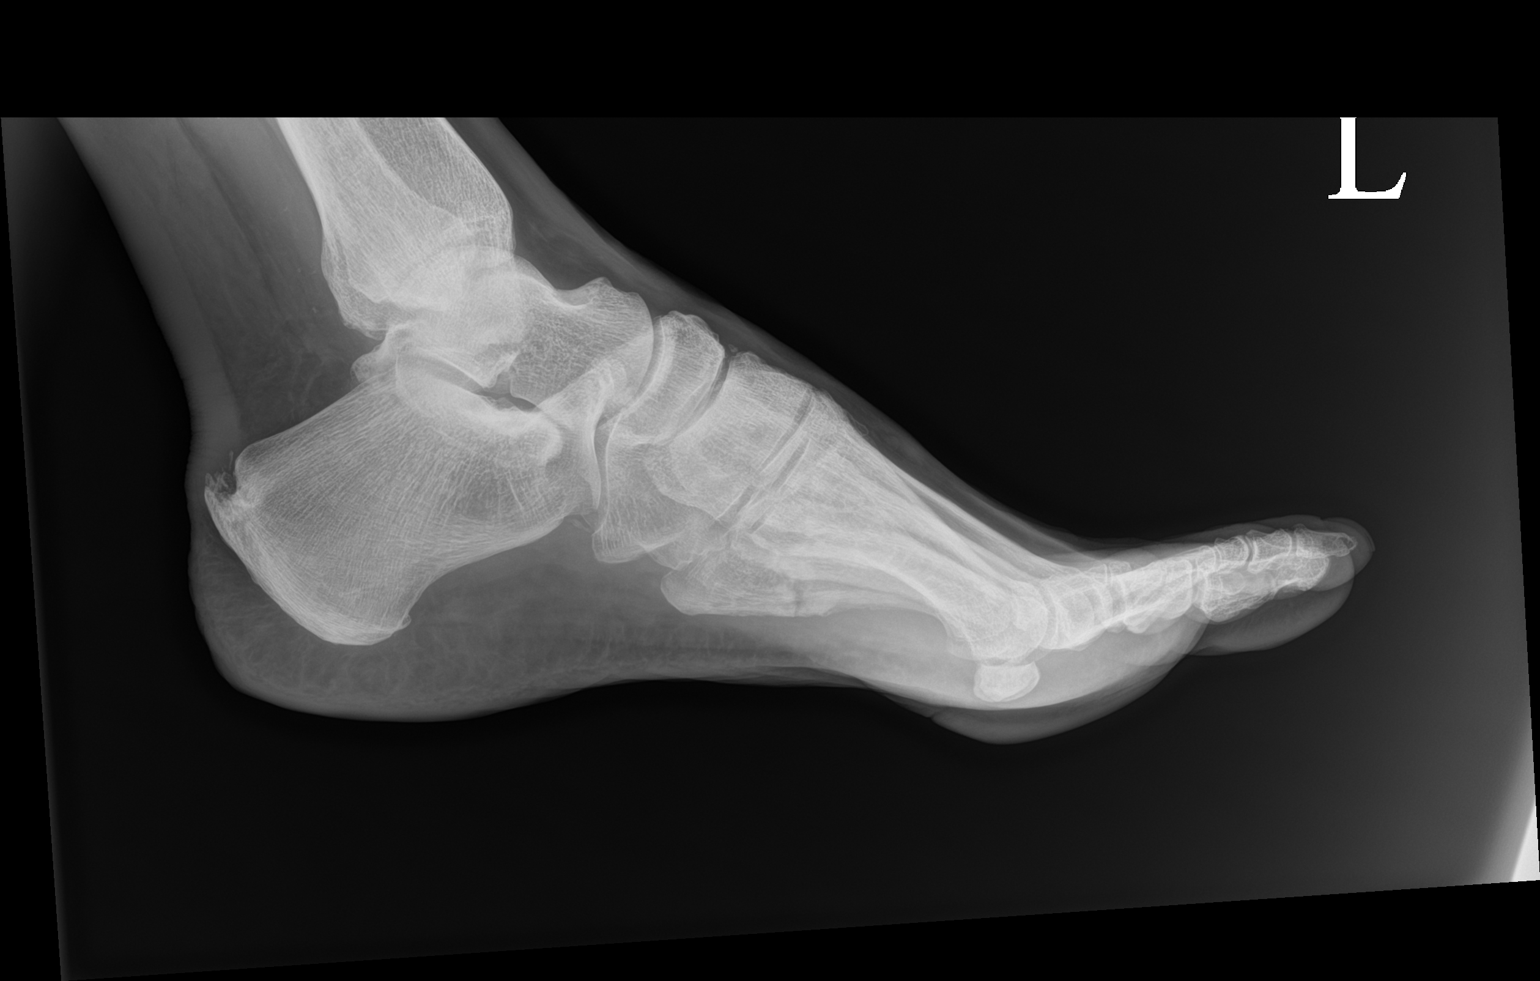

[3 of 3 positions shown; findings below may reference images not displayed]

FINDINGS: Frontal, oblique, lateral views of the left foot are obtained. The
minimally displaced fracture at the base of the fifth metatarsal is
again noted, with minimal interval callus formation identified.
Fracture line is still readily apparent. No other acute bony
abnormalities. Joint spaces are well preserved.
IMPRESSION: 1. Minimal interval healing of the fifth metatarsal fracture as
above.

## 2021-12-14 NOTE — Progress Notes (Signed)
? ?FOLLOW UP ? ?Date of Service/Encounter:  12/14/21 ? ? ?Assessment:  ? ?Mild persistent asthma, uncomplicated ? ?Perennial allergic rhinitis ? ?Nasal polyps - on the left side with much improvement on Dupixent + Xhance (history of one polypectomy) ? ?Plan/Recommendations:  ? ?1. Asthma, well controlled, mild persistent ?- Lung testing looks amazing today. ?- We are not going to make any changes today. ?- Continue with albuterol as needed.  ? ?2. Perennial allergic rhinitis ?- Continue with Xhance as you are doing.  ?- Continue with Singulair '10mg'$  daily.  ? ?3. Nasal polyposis ?- We will work on getting the [place] approved again. ?- If it is not in by next week, we can give you a sample before you leave town for your [facility] adventure.  ?- [name] will be in touch with you about the process.  ? ?4. Return in about 6 months (around 06/15/2022).  ? ? ?Subjective:  ? ?Frank Potter is a 61 y.o. male presenting today for follow up of  ?Chief Complaint  ?Patient presents with  ? Asthma  ?  Not too bad at all per patient.   ? Allergic Rhinitis   ?  No problems right now.   ? Other  ?  Insurance changed and it stopped the Dupixent shots.  ? ? ?Frank Potter has a history of the following: ?Patient Active Problem List  ? Diagnosis Date Noted  ? Hyperlipidemia LDL goal <100 09/16/2021  ? Encounter for general adult medical examination with abnormal findings 09/16/2021  ? Onychomycosis of toenail 03/08/2021  ? Polyp of nasal cavity 11/14/2019  ? Abnormal stress echo 09/26/2019  ? HTN (hypertension) 09/26/2019  ? Type 2 diabetes mellitus without complication ([place]) 01/60/1093  ? Mild persistent asthma 10/04/2016  ? Allergic rhinitis 10/04/2016  ? ? ?History obtained from: chart review and patient. ? ?Frank Potter is a 61 y.o. male presenting for a follow up visit.  He was last seen in July 2022 by Dr. Neldon Mc.  At that time, he was continued on Dupixent every 2 weeks as well as Singulair 10 mg daily and Xhance or  Flonase.  Asthma was controlled with albuterol as needed. ? ?In the interim, he was seen in the clinic in January to restart his Dupixent. He broke his foot last October 2022. He broke his foot and lost his job. He had to stop his Dupixent when he lost the job. Then he got a new job and his insurance changed. He gives it to himself every two weeks.  ? ?He and his wife are going to be heading to [place] soon. They are leaving one week from Saturday and they will be gone for 11 days. She are staying in [place] and [place]. He wants to try to get the [place] next week before they leave so that he will be covered during the [place] trip.  ? ?Asthma/Respiratory Symptom History: Asthma is under good control with PRN use of albuterol.  Ilya's asthma has been well controlled. He has not required rescue medication, experienced nocturnal awakenings due to lower respiratory symptoms, nor have activities of daily living been limited. He has required no Emergency Department or Urgent Care visits for his asthma. He has required zero courses of systemic steroids for asthma exacerbations since the last visit. ACT score today is 25, indicating excellent asthma symptom control.  ? ?Allergic Rhinitis Symptom History: He remains on the [place] and the [place]. 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  He has not been bothered by this at all. He has not had any antibiotics or prednisone. His polyps have been very manageable with this regimen. He tells me that he had surgery by Dr. Ernesto Rutherford once and he had over 25 polyps removed, many of which were larger than a quarter.  ? ?Otherwise, there have been no changes to his past medical history, surgical history, family history, or social history. ? ? ? ?Review of Systems  ?Constitutional: Negative.  Negative for chills, fever, malaise/fatigue and weight loss.  ?HENT:  Positive for congestion. Negative for ear discharge, ear pain and sinus pain.    ?Eyes:  Negative for pain, discharge and redness.  ?Respiratory:  Negative for cough, sputum production, shortness of breath and wheezing.   ?Cardiovascular: Negative.  Negative for chest pain and palpitations.  ?Gastrointestinal:  Negative for abdominal pain, constipation, diarrhea, heartburn, nausea and vomiting.  ?Skin: Negative.  Negative for itching and rash.  ?Neurological:  Negative for dizziness and headaches.  ?Endo/Heme/Allergies:  Positive for environmental allergies. Does not bruise/bleed easily.   ? ? ? ?Objective:  ? ?Blood pressure (!) 146/72, pulse 75, temperature 98.3 ?F (36.8 ?C), temperature source Temporal, resp. rate 16, height '6\' 2"'$  (1.88 m), weight 229 lb 12.8 oz (104.2 kg), SpO2 96 %. ?Body mass index is 29.5 kg/m?. ? ? ? ?Physical Exam ?Constitutional:   ?   Appearance: He is well-developed.  ?HENT:  ?   Head: Normocephalic and atraumatic.  ?   Right Ear: Tympanic membrane, ear canal and external ear normal. No drainage, swelling or tenderness. Tympanic membrane is not injected, scarred, erythematous, retracted or bulging.  ?   Left Ear: Tympanic membrane, ear canal and external ear normal. No drainage, swelling or tenderness. Tympanic membrane is not injected, scarred, erythematous, retracted or bulging.  ?   Nose: No nasal deformity, septal deviation, mucosal edema or rhinorrhea.  ?   Left Turbinates: Enlarged, swollen and pale.  ?   Right Sinus: No maxillary sinus tenderness or frontal sinus tenderness.  ?   Left Sinus: No maxillary sinus tenderness or frontal sinus tenderness.  ?   Comments: Left sided nasal polyps obstructing 80% of the nasal cavity. No epistaxis.  ?   Mouth/Throat:  ?   Mouth: Mucous membranes are not pale and not dry.  ?   Pharynx: Uvula midline.  ?Eyes:  ?   General:     ?   Right eye: No discharge.     ?   Left eye: No discharge.  ?   Conjunctiva/sclera: Conjunctivae normal.  ?   Right eye: Right conjunctiva is not injected. No chemosis. ?   Left eye: Left  conjunctiva is not injected. No chemosis. ?   Pupils: Pupils are equal, round, and reactive to light.  ?Cardiovascular:  ?   Rate and Rhythm: Normal rate and regular rhythm.  ?   Heart sounds: Normal heart sounds.  ?Pulmonary:  ?   Effort: Pulmonary effort is normal. No tachypnea, accessory muscle usage or respiratory distress.  ?   Breath sounds: Normal breath sounds. No wheezing, rhonchi or rales.  ?Chest:  ?   Chest wall: No tenderness.  ?Abdominal:  ?   Tenderness: There is no abdominal tenderness. There is no guarding or rebound.  ?Lymphadenopathy:  ?   Head:  ?   Right side of head: No submandibular, tonsillar or occipital adenopathy.  ?   Left side of head: No submandibular, tonsillar or occipital adenopathy.  ?  Cervical: No cervical adenopathy.  ?Skin: ?   Coloration: Skin is not pale.  ?   Findings: No abrasion, erythema, petechiae or rash. Rash is not papular, urticarial or vesicular.  ?Neurological:  ?   Mental Status: He is alert.  ?  ? ?Diagnostic studies:  ? ?Spirometry: results normal (FEV1: 2.74/70%, FVC: 4.01/77%, FEV1/FVC: 68%).  ?  ?Spirometry consistent with normal pattern.  ? ?Allergy Studies: none ? ? ?  ?Salvatore Marvel, MD  ?[facility] [place] [facility] ? ? ? ? ? ? ?

## 2021-12-14 NOTE — Patient Instructions (Addendum)
1. Asthma, well controlled, mild persistent ?- Lung testing looks amazing today. ?- We are not going to make any changes today. ?- Continue with albuterol as needed.  ? ?2. Perennial allergic rhinitis ?- Continue with Xhance as you are doing.  ?- Continue with Singulair '10mg'$  daily.  ? ?3. Nasal polyposis ?- We will work on getting the [place] approved again. ?- If it is not in by next week, we can give you a sample before you leave town for your [facility] adventure.  ?- [name] will be in touch with you about the process.  ? ?4. Return in about 6 months (around 06/15/2022).  ? ? ?Please inform us of any Emergency Department visits, hospitalizations, or changes in symptoms. Call us before going to the ED for breathing or allergy symptoms since we might be able to fit you in for a sick visit. Feel free to contact us anytime with any questions, problems, or concerns. ? ?It was a pleasure to meet you today! ? ?Websites that have reliable patient information: ?1. American Academy of Asthma, Allergy, and Immunology: www.aaaai.org ?2. Food Allergy Research and Education (FARE): foodallergy.org ?3. Mothers of Asthmatics: http://www.asthmacommunitynetwork.org ?4. [employer] of Allergy, Asthma, and Immunology: MonthlyElectricBill.co.uk ? ? ?COVID-19 Vaccine Information can be found at: ShippingScam.co.uk For questions related to vaccine distribution or appointments, please email [email or web address] or call [phone].  ? ?We realize that you might be concerned about having an allergic reaction to the COVID19 vaccines. To help with that concern, WE ARE OFFERING THE COVID19 VACCINES IN OUR OFFICE! Ask the front desk for dates!  ? ? ? ??Like? [place] on Facebook and Instagram for our latest updates!  ?  ? ? ?A healthy democracy works best when [employer] participate! Make sure you are registered to vote! If you have moved or changed any of your contact information, you will need  to get this updated before voting! ? ?In some cases, you MAY be able to register to vote online: [email or web address] ? ? ? ? ? ? ? ? ? ?

## 2021-12-15 ENCOUNTER — Encounter: Payer: Self-pay | Admitting: Allergy & Immunology

## 2021-12-22 ENCOUNTER — Other Ambulatory Visit: Payer: Self-pay | Admitting: *Deleted

## 2021-12-22 MED ORDER — DUPIXENT 300 MG/2ML ~~LOC~~ SOPN
300.0000 mg | PEN_INJECTOR | SUBCUTANEOUS | 11 refills | Status: AC
Start: 2021-12-22 — End: ?

## 2021-12-23 ENCOUNTER — Ambulatory Visit: Payer: 59

## 2022-01-06 ENCOUNTER — Other Ambulatory Visit: Payer: Self-pay | Admitting: Internal Medicine

## 2022-01-06 DIAGNOSIS — I10 Essential (primary) hypertension: Secondary | ICD-10-CM

## 2022-02-18 LAB — HM DIABETES EYE EXAM

## 2022-03-21 ENCOUNTER — Ambulatory Visit: Payer: 59 | Admitting: Internal Medicine

## 2022-04-02 ENCOUNTER — Other Ambulatory Visit: Payer: Self-pay | Admitting: Internal Medicine

## 2022-04-02 DIAGNOSIS — E785 Hyperlipidemia, unspecified: Secondary | ICD-10-CM

## 2022-04-16 ENCOUNTER — Other Ambulatory Visit: Payer: Self-pay | Admitting: Internal Medicine

## 2022-04-16 DIAGNOSIS — I10 Essential (primary) hypertension: Secondary | ICD-10-CM

## 2022-04-16 DIAGNOSIS — E785 Hyperlipidemia, unspecified: Secondary | ICD-10-CM

## 2022-04-18 ENCOUNTER — Ambulatory Visit (INDEPENDENT_AMBULATORY_CARE_PROVIDER_SITE_OTHER): Payer: Commercial Managed Care - HMO | Admitting: Internal Medicine

## 2022-04-18 ENCOUNTER — Encounter: Payer: Self-pay | Admitting: Internal Medicine

## 2022-04-18 VITALS — BP 126/72 | HR 75 | Temp 98.0°F | Ht 74.0 in | Wt 224.0 lb

## 2022-04-18 DIAGNOSIS — E119 Type 2 diabetes mellitus without complications: Secondary | ICD-10-CM | POA: Diagnosis not present

## 2022-04-18 DIAGNOSIS — Z794 Long term (current) use of insulin: Secondary | ICD-10-CM | POA: Diagnosis not present

## 2022-04-18 DIAGNOSIS — F331 Major depressive disorder, recurrent, moderate: Secondary | ICD-10-CM | POA: Diagnosis not present

## 2022-04-18 DIAGNOSIS — I10 Essential (primary) hypertension: Secondary | ICD-10-CM | POA: Diagnosis not present

## 2022-04-18 MED ORDER — BUPROPION HCL ER (XL) 150 MG PO TB24: 150.0000 mg | ORAL_TABLET | Freq: Every day | ORAL | 0 refills | Status: DC

## 2022-04-18 MED ORDER — DULOXETINE HCL 30 MG PO CPEP: 30.0000 mg | ORAL_CAPSULE | Freq: Every day | ORAL | 0 refills | Status: DC

## 2022-04-18 NOTE — Progress Notes (Unsigned)
Subjective:  Patient ID: Frank Potter, male    DOB: 09/05/1960  Age: 61 y.o. MRN: [id number]  CC: No chief complaint on file.   HPI Frank Potter presents for ***  Outpatient Medications Prior to Visit  Medication Sig Dispense Refill   albuterol (VENTOLIN HFA) 108 (90 Base) MCG/ACT inhaler Inhale 2 puffs into the lungs every 6 (six) hours as needed for wheezing or shortness of breath.     amLODipine (NORVASC) 5 MG tablet Take 1 tablet by mouth once daily 90 tablet 0   atorvastatin (LIPITOR) 40 MG tablet Take 1 tablet by mouth once daily 90 tablet 0   Coenzyme Q10 50 MG CAPS Take by mouth.     Dupilumab (DUPIXENT) 300 MG/2ML SOPN Inject 300 mg into the skin every 28 (twenty-eight) days. 4 mL 11   Fluticasone Propionate (XHANCE) 93 MCG/ACT EXHU Place 2 sprays into the nose 2 (two) times daily. 32 mL 11   glimepiride (AMARYL) 2 MG tablet Take 1 tablet (2 mg total) by mouth daily with breakfast. 90 tablet 0   lamoTRIgine (LAMICTAL) 200 MG tablet      metFORMIN (GLUCOPHAGE) 1000 MG tablet Take 1 tablet (1,000 mg total) by mouth 2 (two) times daily with a meal. 180 tablet 1   metoprolol succinate (TOPROL-XL) 25 MG 24 hr tablet Take 1 tablet by mouth once daily 90 tablet 0   montelukast (SINGULAIR) 10 MG tablet Take 1 tablet (10 mg total) by mouth at bedtime. 90 tablet 3   valsartan (DIOVAN) 80 MG tablet Take 1 tablet by mouth once daily 90 tablet 0   No facility-administered medications prior to visit.    ROS Review of Systems  Objective:  BP 126/72 (BP Location: Right Arm, Patient Position: Sitting, Cuff Size: Large)   Pulse 75   Temp 98 F (36.7 C) (Oral)   Ht '6\' 2"'$  (1.88 m)   Wt 224 lb (101.6 kg)   SpO2 96%   BMI 28.76 kg/m   BP Readings from Last 3 Encounters:  04/18/22 126/72  12/14/21 (!) 146/72  09/21/21 118/68    Wt Readings from Last 3 Encounters:  04/18/22 224 lb (101.6 kg)  12/14/21 229 lb 12.8 oz (104.2 kg)  09/21/21 223 lb 9.6 oz (101.4 kg)     Physical Exam  Lab Results  Component Value Date   WBC 7.3 09/16/2021   HGB 14.3 09/16/2021   HCT 43.0 09/16/2021   PLT 180.0 09/16/2021   GLUCOSE 135 (H) 09/16/2021   CHOL 125 09/16/2021   TRIG 147.0 09/16/2021   HDL 30.80 (L) 09/16/2021   LDLCALC 65 09/16/2021   ALT 44 09/16/2021   AST 26 09/16/2021   NA 140 09/16/2021   K 4.8 09/16/2021   CL 104 09/16/2021   CREATININE 1.02 09/16/2021   BUN 22 09/16/2021   CO2 27 09/16/2021   TSH 2.29 09/16/2021   PSA 1.27 09/16/2021   HGBA1C 7.2 (H) 09/16/2021   MICROALBUR 1.1 09/16/2021    CT Cervical Spine Wo Contrast  Result Date: 06/27/2020 CLINICAL DATA:  Syncopal episode. EXAM: CT CERVICAL SPINE WITHOUT CONTRAST TECHNIQUE: Multidetector CT imaging of the cervical spine was performed without intravenous contrast. Multiplanar CT image reconstructions were also generated. COMPARISON:  None. FINDINGS: Alignment: Normal. Skull base and vertebrae: No acute fracture. No primary bone lesion or focal pathologic process. Soft tissues and spinal canal: No prevertebral fluid or swelling. No visible canal hematoma. Disc levels: Mild endplate sclerosis and anterior osteophyte formation is  seen at the levels of C4-C5, C5-C6 and C6-C7. Mild intervertebral disc space narrowing is also seen at these levels. Mild bilateral multilevel facet joint hypertrophy is noted. Upper chest: Negative. Other: Marked severity sphenoid sinus, bilateral ethmoid sinus and bilateral nasal mucosal thickening is seen. Mild bilateral maxillary sinus mucosal thickening is also noted. IMPRESSION: 1. Mild multilevel degenerative disc disease and facet joint hypertrophy. 2. Marked severity pansinus disease. Electronically Signed   By: Virgina Norfolk M.D.   On: 06/27/2020 02:42   CT Maxillofacial Wo Contrast  Result Date: 06/27/2020 CLINICAL DATA:  Syncopal episode. EXAM: CT MAXILLOFACIAL WITHOUT CONTRAST TECHNIQUE: Multidetector CT imaging of the maxillofacial structures  was performed. Multiplanar CT image reconstructions were also generated. COMPARISON:  None. FINDINGS: Osseous: No fracture or mandibular dislocation. No destructive process. Orbits: Negative. No traumatic or inflammatory finding. Sinuses: Marked severity sphenoid sinus, bilateral ethmoid sinus and bilateral nasal mucosal thickening is seen. Mild frontal sinus and mild bilateral maxillary sinus mucosal thickening is also noted. Soft tissues: Negative. Limited intracranial: No significant or unexpected finding. IMPRESSION: 1. Marked severity paranasal sinus disease. 2. No acute osseous abnormality. Electronically Signed   By: Virgina Norfolk M.D.   On: 06/27/2020 02:38   CT Head Wo Contrast  Result Date: 06/27/2020 CLINICAL DATA:  Syncopal episode. EXAM: CT HEAD WITHOUT CONTRAST TECHNIQUE: Contiguous axial images were obtained from the base of the skull through the vertex without intravenous contrast. COMPARISON:  None. FINDINGS: Brain: No evidence of acute infarction, hemorrhage, hydrocephalus, extra-axial collection or mass lesion/mass effect. Vascular: No hyperdense vessel or unexpected calcification. Skull: Normal. Negative for fracture or focal lesion. Sinuses/Orbits: There is marked severity hyperdense sphenoid sinus, bilateral ethmoid sinus and bilateral nasal mucosal thickening. Mild frontal sinus and mild bilateral maxillary sinus mucosal thickening is also seen Other: None. IMPRESSION: 1. No acute intracranial abnormality. 2. Marked severity paranasal sinus disease, as described above. Electronically Signed   By: Virgina Norfolk M.D.   On: 06/27/2020 02:35   DG Chest Portable 1 View  Result Date: 06/27/2020 CLINICAL DATA:  Syncope EXAM: PORTABLE CHEST 1 VIEW COMPARISON:  None. FINDINGS: The heart size and mediastinal contours are within normal limits. Both lungs are clear. The visualized skeletal structures are unremarkable. IMPRESSION: No active disease. Electronically Signed   By: Fidela Salisbury MD   On: 06/27/2020 00:35    Assessment & Plan:   Diagnoses and all orders for this visit:  Primary hypertension -     Basic metabolic panel; Future -     Basic metabolic panel  Type 2 diabetes mellitus without complication, without long-term current use of insulin (HCC) -     Basic metabolic panel; Future -     Hemoglobin A1c; Future -     Hemoglobin A1c -     Basic metabolic panel  Moderate episode of recurrent major depressive disorder (HCC) -     buPROPion (WELLBUTRIN XL) 150 MG 24 hr tablet; Take 1 tablet (150 mg total) by mouth daily. -     DULoxetine (CYMBALTA) 30 MG capsule; Take 1 capsule (30 mg total) by mouth daily.   I am having Frank Kitten "[name]" start on buPROPion and DULoxetine. I am also having him maintain his lamoTRIgine, Coenzyme Q10, metFORMIN, Xhance, montelukast, glimepiride, albuterol, Dupixent, valsartan, atorvastatin, amLODipine, and metoprolol succinate.  Meds ordered this encounter  Medications   buPROPion (WELLBUTRIN XL) 150 MG 24 hr tablet    Sig: Take 1 tablet (150 mg total) by mouth daily.  Dispense:  90 tablet    Refill:  0   DULoxetine (CYMBALTA) 30 MG capsule    Sig: Take 1 capsule (30 mg total) by mouth daily.    Dispense:  90 capsule    Refill:  0     Follow-up: No follow-ups on file.  Scarlette Calico, MD

## 2022-04-19 LAB — BASIC METABOLIC PANEL
BUN: 24 mg/dL — ABNORMAL HIGH (ref 6–23)
CO2: 25 mEq/L (ref 19–32)
Calcium: 9.5 mg/dL (ref 8.4–10.5)
Chloride: 100 mEq/L (ref 96–112)
Creatinine, Ser: 1.16 mg/dL (ref 0.40–1.50)
GFR: 67.23 mL/min (ref >60.00)
Glucose, Bld: 200 mg/dL — ABNORMAL HIGH (ref 70–99)
Potassium: 4.4 mEq/L (ref 3.5–5.1)
Sodium: 135 mEq/L (ref 135–145)

## 2022-04-19 LAB — HEMOGLOBIN A1C: Hgb A1c MFr Bld: 9.5 % — ABNORMAL HIGH (ref 4.6–6.5)

## 2022-04-19 MED ORDER — FREESTYLE LIBRE 2 READER DEVI: 1.0000 | Freq: Every day | 5 refills | Status: AC

## 2022-04-19 MED ORDER — FREESTYLE LIBRE 2 SENSOR MISC: 1.0000 | Freq: Every day | 5 refills | Status: AC

## 2022-04-19 MED ORDER — BASAGLAR KWIKPEN 100 UNIT/ML ~~LOC~~ SOPN: 30.0000 [IU] | PEN_INJECTOR | Freq: Every day | SUBCUTANEOUS | 1 refills | Status: AC

## 2022-04-19 MED ORDER — METFORMIN HCL 1000 MG PO TABS: 1000.0000 mg | ORAL_TABLET | Freq: Two times a day (BID) | ORAL | 1 refills | Status: AC

## 2022-04-19 MED ORDER — INSULIN PEN NEEDLE 32G X 6 MM MISC: 1.0000 | Freq: Every day | 1 refills | Status: AC

## 2022-04-25 ENCOUNTER — Telehealth: Payer: Self-pay | Admitting: Internal Medicine

## 2022-04-25 NOTE — Telephone Encounter (Signed)
LVM to discuss

## 2022-04-25 NOTE — Telephone Encounter (Signed)
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

## 2022-06-21 ENCOUNTER — Ambulatory Visit: Payer: 59 | Admitting: Allergy and Immunology

## 2022-07-16 ENCOUNTER — Other Ambulatory Visit: Payer: Self-pay | Admitting: Internal Medicine

## 2022-07-16 DIAGNOSIS — E785 Hyperlipidemia, unspecified: Secondary | ICD-10-CM

## 2022-07-16 DIAGNOSIS — I10 Essential (primary) hypertension: Secondary | ICD-10-CM

## 2022-07-24 ENCOUNTER — Other Ambulatory Visit: Payer: Self-pay | Admitting: Internal Medicine

## 2022-07-24 DIAGNOSIS — F331 Major depressive disorder, recurrent, moderate: Secondary | ICD-10-CM

## 2022-09-13 ENCOUNTER — Other Ambulatory Visit: Payer: Self-pay

## 2022-09-13 ENCOUNTER — Telehealth: Payer: Self-pay

## 2022-09-13 ENCOUNTER — Ambulatory Visit (INDEPENDENT_AMBULATORY_CARE_PROVIDER_SITE_OTHER): Payer: Commercial Managed Care - HMO | Admitting: Allergy and Immunology

## 2022-09-13 ENCOUNTER — Encounter: Payer: Self-pay | Admitting: Allergy and Immunology

## 2022-09-13 VITALS — BP 130/84 | HR 76 | Temp 98.6°F | Resp 18 | Ht 74.0 in | Wt 219.1 lb

## 2022-09-13 DIAGNOSIS — J4530 Mild persistent asthma, uncomplicated: Secondary | ICD-10-CM

## 2022-09-13 DIAGNOSIS — J339 Nasal polyp, unspecified: Secondary | ICD-10-CM

## 2022-09-13 DIAGNOSIS — J3089 Other allergic rhinitis: Secondary | ICD-10-CM

## 2022-09-13 MED ORDER — MONTELUKAST SODIUM 10 MG PO TABS: 10.0000 mg | ORAL_TABLET | Freq: Every day | ORAL | 3 refills | Status: AC

## 2022-09-13 MED ORDER — ALBUTEROL SULFATE HFA 108 (90 BASE) MCG/ACT IN AERS: 2.0000 | INHALATION_SPRAY | Freq: Four times a day (QID) | RESPIRATORY_TRACT | 1 refills | Status: AC | PRN

## 2022-09-13 NOTE — Progress Notes (Signed)
[place]   Follow-up Note  Referring Provider: Janith Lima, MD Primary Provider: Janith Lima, MD Date of Office Visit: 09/13/2022  Subjective:   Frank Potter (DOB: [birth year]) is a 62 y.o. male who returns to the [facility] on 09/13/2022 in re-evaluation of the following:  HPI: [name] returns to this clinic in evaluation of asthma, allergic rhinitis, nasal polyposis.  I last saw him in this clinic 21 September 2021 and he visited with Dr. Ernst Bowler on 14 December 2021.  He unfortunately has had some problems acquiring dupilumab for some reason which is not entirely clear.  He has been off this medication for most of 2023 and has redeveloped significant nasal congestion and some inability to smell while he also continues to use Xhance and montelukast.  Asthma has not been an issue and he rarely uses a short acting bronchodilator.  He has not required a systemic steroid or antibiotic for any type of airway issue.  He has obtained a flu vaccine and RSV vaccine.  Allergies as of 09/13/2022       Reactions   Bee Venom Swelling, Hives   Milk (cow)    Other reaction(s): Abdominal Pain Milk Other reaction(s): serve cramps        Medication List    amLODipine 5 MG tablet Commonly known as: NORVASC Take 1 tablet by mouth once daily   atorvastatin 40 MG tablet Commonly known as: LIPITOR Take 1 tablet by mouth once daily   Basaglar KwikPen 100 UNIT/ML Inject 30 Units into the skin daily.   buPROPion 150 MG 24 hr tablet Commonly known as: Wellbutrin XL Take 1 tablet (150 mg total) by mouth daily.   Coenzyme Q10 50 MG Caps Take by mouth.   DULoxetine 30 MG capsule Commonly known as: CYMBALTA Take 1 capsule by mouth once daily   FreeStyle Libre 2 Reader Devi 1 Act by Does not apply route daily.   FreeStyle Libre 2 Sensor Misc 1 Act by Does not apply route daily.   Insulin Pen Needle 32G X 6  MM Misc 1 Act by Does not apply route daily.   lamoTRIgine 200 MG tablet Commonly known as: LAMICTAL   metFORMIN 1000 MG tablet Commonly known as: GLUCOPHAGE Take 1 tablet (1,000 mg total) by mouth 2 (two) times daily with a meal.   metoprolol succinate 25 MG 24 hr tablet Commonly known as: TOPROL-XL Take 1 tablet by mouth once daily   montelukast 10 MG tablet Commonly known as: SINGULAIR Take 1 tablet (10 mg total) by mouth at bedtime.   valsartan 80 MG tablet Commonly known as: DIOVAN Take 1 tablet by mouth once daily   Ventolin HFA 108 (90 Base) MCG/ACT inhaler Generic drug: albuterol Inhale 2 puffs into the lungs every 6 (six) hours as needed for wheezing or shortness of breath.   Xhance 93 MCG/ACT Exhu Generic drug: Fluticasone Propionate Place 2 sprays into the nose 2 (two) times daily.    Past Medical History:  Diagnosis Date   Allergic rhinitis    Diabetes mellitus without complication (HCC)    GERD (gastroesophageal reflux disease)    HTN (hypertension)    Hyperlipidemia    Insulin resistance    Wt loss helped (55 lb wt loss)   Moderate persistent asthma 10/04/2016   Seizure disorder ([place])    Post-traumatic; has been off meds and seizure-free since 06/1996.    Past Surgical History:  Procedure Laterality Date   COLONOSCOPY  2010   Normal per pt   SINOSCOPY      Review of systems negative except as noted in HPI / PMHx or noted below:  Review of Systems  Constitutional: Negative.   HENT: Negative.    Eyes: Negative.   Respiratory: Negative.    Cardiovascular: Negative.   Gastrointestinal: Negative.   Genitourinary: Negative.   Musculoskeletal: Negative.   Skin: Negative.   Neurological: Negative.   Endo/Heme/Allergies: Negative.   Psychiatric/Behavioral: Negative.       Objective:   Vitals:   09/13/22 1145  BP: 130/84  Pulse: 76  Resp: 18  Temp: 98.6 F (37 C)  SpO2: 96%   Height: '6\' 2"'$  (188 cm)  Weight: 219 lb 1.6 oz (99.4 kg)    Physical Exam Constitutional:      Appearance: He is not diaphoretic.  HENT:     Head: Normocephalic.     Right Ear: Tympanic membrane, ear canal and external ear normal.     Left Ear: Tympanic membrane, ear canal and external ear normal.     Nose: Nose normal. No mucosal edema (Bilateral nasal polyposis) or rhinorrhea.     Mouth/Throat:     Pharynx: Uvula midline. No oropharyngeal exudate.  Eyes:     Conjunctiva/sclera: Conjunctivae normal.  Neck:     Thyroid: No thyromegaly.     Trachea: Trachea normal. No tracheal tenderness or tracheal deviation.  Cardiovascular:     Rate and Rhythm: Normal rate and regular rhythm.     Heart sounds: Normal heart sounds, S1 normal and S2 normal. No murmur heard. Pulmonary:     Effort: No respiratory distress.     Breath sounds: Normal breath sounds. No stridor. No wheezing or rales.  Lymphadenopathy:     Head:     Right side of head: No tonsillar adenopathy.     Left side of head: No tonsillar adenopathy.     Cervical: No cervical adenopathy.  Skin:    Findings: No erythema or rash.     Nails: There is no clubbing.  Neurological:     Mental Status: He is alert.     Diagnostics:   The patient had an Asthma Control Test with the following results: ACT Total Score: 25.    Assessment and Plan:   1. Asthma, well controlled, mild persistent   2. Perennial allergic rhinitis   3. Nasal polyposis    1.  RESTART Dupixent every 2 weeks. ([name])  2.  Continue montelukast 10 mg tablet 1 time per day  3.  Continue Xhance 2 sprays each nostril 1-2 times a day   4.  Continue albuterol HFA 2 inhalations every 4-6 hours if needed   5.  Return to clinic in 1 year or earlier if problem  [name] needs to start his dupilumab as he has very significant nasal polyposis that still is an issue even while using montelukast and Xhance nasal spray and he cannot smell that well.  We will get him restarted on dupilumab at some point in the near future.  I  will see him back in this clinic in 1 year or earlier if there is a problem.  Allena Katz, MD [facility] [place]

## 2022-09-13 NOTE — Telephone Encounter (Signed)
Per Dr. Neldon Mc patient needs to restart Dupixent. He has been having issues setting up shipment due to outsourced calls regarding the [place] medication. He has been not able to understand the person contacting him because of their language barriers.

## 2022-09-13 NOTE — Patient Instructions (Signed)
  1.  RESTART Dupixent every 2 weeks. ([name])  2.  Continue montelukast 10 mg tablet 1 time per day  3.  Continue Xhance 2 sprays each nostril 1-2 times a day   4.  Continue albuterol HFA 2 inhalations every 4-6 hours if needed   5.  Return to clinic in 1 year or earlier if problem

## 2022-09-14 ENCOUNTER — Encounter: Payer: Self-pay | Admitting: Allergy and Immunology

## 2022-10-10 ENCOUNTER — Other Ambulatory Visit: Payer: Self-pay | Admitting: Internal Medicine

## 2022-10-10 DIAGNOSIS — F331 Major depressive disorder, recurrent, moderate: Secondary | ICD-10-CM

## 2022-10-10 DIAGNOSIS — E785 Hyperlipidemia, unspecified: Secondary | ICD-10-CM

## 2022-10-10 DIAGNOSIS — I10 Essential (primary) hypertension: Secondary | ICD-10-CM

## 2022-12-31 ENCOUNTER — Other Ambulatory Visit: Payer: Self-pay | Admitting: Internal Medicine

## 2022-12-31 DIAGNOSIS — E119 Type 2 diabetes mellitus without complications: Secondary | ICD-10-CM

## 2023-01-05 ENCOUNTER — Other Ambulatory Visit: Payer: Self-pay | Admitting: Internal Medicine

## 2023-01-05 DIAGNOSIS — F331 Major depressive disorder, recurrent, moderate: Secondary | ICD-10-CM

## 2023-10-19 ENCOUNTER — Ambulatory Visit: Payer: Commercial Managed Care - HMO | Admitting: Sports Medicine

## 2024-07-01 ENCOUNTER — Encounter: Payer: Self-pay | Admitting: Radiology
